# Patient Record
Sex: Male | Born: 1995 | Race: Black or African American | Hispanic: No | Marital: Single | Smoking: Current every day smoker
Health system: Southern US, Community
[De-identification: ages and names within clinical notes are randomized; demographics above are authoritative.]

---
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
## 2016-01-21 ENCOUNTER — Ambulatory Visit (INDEPENDENT_AMBULATORY_CARE_PROVIDER_SITE_OTHER): Payer: Medicaid Other | Admitting: Family Medicine

## 2016-01-21 ENCOUNTER — Encounter: Payer: Self-pay | Admitting: Family Medicine

## 2016-01-21 ENCOUNTER — Other Ambulatory Visit (HOSPITAL_COMMUNITY)
Admission: RE | Admit: 2016-01-21 | Discharge: 2016-01-21 | Disposition: A | Discharge status: Home / Self Care | Payer: Medicaid Other | Source: Ambulatory Visit | Attending: Family Medicine | Admitting: Family Medicine

## 2016-01-21 VITALS — BP 118/73 | HR 62 | Temp 97.7°F | Wt 187.0 lb

## 2016-01-21 DIAGNOSIS — Z202 Contact with and (suspected) exposure to infections with a predominantly sexual mode of transmission: Secondary | ICD-10-CM | POA: Diagnosis not present

## 2016-01-21 DIAGNOSIS — A630 Anogenital (venereal) warts: Secondary | ICD-10-CM

## 2016-01-21 DIAGNOSIS — M940 Chondrocostal junction syndrome [Tietze]: Secondary | ICD-10-CM | POA: Insufficient documentation

## 2016-01-21 DIAGNOSIS — Z113 Encounter for screening for infections with a predominantly sexual mode of transmission: Secondary | ICD-10-CM

## 2016-01-21 DIAGNOSIS — Z20828 Contact with and (suspected) exposure to other viral communicable diseases: Secondary | ICD-10-CM | POA: Diagnosis not present

## 2016-01-21 MED ORDER — IMIQUIMOD 5 % EX CREA: TOPICAL_CREAM | CUTANEOUS | Status: DC

## 2016-01-21 MED ORDER — NAPROXEN 500 MG PO TABS: 500.0000 mg | ORAL_TABLET | Freq: Two times a day (BID) | ORAL | Status: DC

## 2016-01-21 NOTE — Progress Notes (Signed)
   Subjective:   Zachary Mccarty is a 20 y.o. male with a history of THC use disorder here for same day visit for cough with chest pain and black dots on penis  Cough - present for 1 wk  - chest pain in L side when coughing or moving suddenly - wakes up with pain in chest in AM - L side pain when coughing as well - no h/o pulm/cardiac problems - non-productive - denies fevers, nasal congestion - no medications for this  Black dots on penis - present for ~ 5 months  - treated for chlamydia 11/06/15 - new sexual partner with 1 male - unprotected - painful with sexual intercourse - not changing at all - no penile discharge or urinary symptoms   Would like to come back for appt to discuss gynecomastia further  Review of Systems:  Per HPI.   Social History: current smoker - 1 cig q2 wks  Objective:  BP 118/73 mmHg  Pulse 62  Temp(Src) 97.7 F (36.5 C) (Oral)  Wt 187 lb (84.823 kg)  Gen:  20 y.o. male in NAD HEENT: NCAT, MMM, EOMI, PERRL, anicteric sclerae CV: RRR, no MRG Resp: Non-labored, CTAB, no wheezes noted Abd: Soft, NTND, BS present, no guarding or organomegaly Male genitalia: no testicular masses Penis: circumcised and warts Testicles: normal, no masses Scrotum: normal Ext: WWP, no edema MSK: TTP over anterior rib cartilage  Neuro: Alert and oriented, speech normal     Assessment & Plan:     Zachary Mccarty is a 20 y.o. 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) Exam consistent with genital warts Explained in detail what this is and about HPV which causes this Advised no unprotected sex Advised discussing with partner Also screen for RPR, HIV, GC chlamydia today Treat with Imiquimod cream 3 times weekly until resolution No more than 16 weeks of treatment Follow-up in 4 weeks  Costochondritis Exam consistent with costochondritis of left rib cage Lungs clear no evidence of pneumonia No evidence of cardiac etiology for  chest pain Start naproxen twice a day Follow-up as needed       Erasmo DownerAngela M Bacigalupo, MD MPH PGY-3,  [facility] 01/21/2016  12:26 PM

## 2016-01-21 NOTE — Assessment & Plan Note (Signed)
Exam consistent with genital warts Explained in detail what this is and about HPV which causes this Advised no unprotected sex Advised discussing with partner Also screen for RPR, HIV, GC chlamydia today Treat with Imiquimod cream 3 times weekly until resolution No more than 16 weeks of treatment Follow-up in 4 weeks

## 2016-01-21 NOTE — Patient Instructions (Signed)
Use Imiquimod cream three times per week (Monday, Wednesday, Friday) at bedtime. Apply to all of the warts. Wash this off in the morning with soap and water. Continue to use this 3 times weekly until the warts are completely gone. Will use it for a maximum of 16 weeks.  Your rib pain is call costochondritis. See more information below. Take naproxen twice daily to help with inflammation and pain.  Come back in about one month to follow-up on the warts and talk about the gynecomastia.  Genital Warts Genital warts are a common STD (sexually transmitted disease). They may appear as small bumps on the tissues of the genital area or anal area. Sometimes, they can become irritated and cause pain. Genital warts are easily passed to other people through sexual contact. Getting treatment is important because genital warts can lead to other problems. In females, the virus that causes genital warts may increase the risk of cervical cancer. CAUSES Genital warts are caused by a virus that is called human papillomavirus (HPV). HPV is spread by having unprotected sex with an infected person. It can be spread through vaginal, anal, and oral sex. Many people do not know that they are infected. They may be infected for years without problems. However, even if they do not have problems, they can pass the infection to their sexual partners. RISK FACTORS Genital warts are more likely to develop in:  People who have unprotected sex.  People who have multiple sexual partners.  People who become sexually active before they are 19 years of age.  Men who are not circumcised.  Women who have a male sexual partner who is not circumcised.  People who have a weakened body defense system (immune system) due to disease or medicine.  People who smoke. SYMPTOMS Symptoms of genital warts include:  Small growths in the genital area or anal area. These warts often grow in clusters.  Itching and irritation in the genital  area or anal area.  Bleeding from the warts.  Painful sexual intercourse. DIAGNOSIS Genital warts can usually be diagnosed from their appearance on the vagina, vulva, penis, perineum, anus, or rectum. Tests may also be done, such as:  Biopsy. A tissue sample is removed so it can be looked at under a microscope.  Colposcopy. In females, a magnifying tool is used to examine the vagina and cervix. Certain solutions may be used to make the HPV cells change color so they can be seen more easily.  A Pap test in females.  Tests for other STDs. TREATMENT Treatment for genital warts may include:  Applying prescription medicines to the warts. These may be solutions or creams.  Freezing the warts with liquid nitrogen (cryotherapy).  Burning the warts with:  Laser treatment.  An electrified probe (electrocautery).  Injecting a substance (Candida antigen or Trichophyton antigen) into the warts to help the body's immune system to fight off the warts.  Interferon injections.  Surgery to remove the warts. HOME CARE INSTRUCTIONS Medicines  Apply over-the-counter and prescription medicines only as told by your health care provider.  Do not treat genital warts with medicines that are used for treating hand warts.  Talk with your health care provider about using over-the-counter anti-itch creams. General Instructions  Do not touch or scratch the warts.  Do not have sex until your treatment has been completed.  Tell your current and past sexual partners about your condition because they may also need treatment.  Keep all follow-up visits as told by your  health care provider. This is important.  After treatment, use condoms during sex to prevent future infections. Other Instructions for Women  Women who have genital warts might need increased screening for cervical cancer. This type of cancer is slow growing and can be cured if it is found early. Chances of developing cervical cancer  are increased with HPV.  If you become pregnant, tell your health care provider that you have had HPV. Your health care provider will monitor you closely during pregnancy to be sure that your baby is safe. PREVENTION Talk with your health care provider about getting the HPV vaccines. These vaccines prevent some HPV infections and cancers. It is recommended that the vaccine be given to males and females who are 57-54 years of age. It will not work if you already have HPV, and it is not recommended for pregnant women. SEEK MEDICAL CARE IF:  You have redness, swelling, or pain in the area of the treated skin.  You have a fever.  You feel generally ill.  You feel lumps in and around your genital area or anal area.  You have bleeding in your genital area or anal area.  You have pain during sexual intercourse.   This information is not intended to replace advice given to you by your health care provider. Make sure you discuss any questions you have with your health care provider.   Document Released: 07/04/2000 Document Revised: 03/28/2015 Document Reviewed: 10/02/2014 Elsevier Interactive Patient Education 2016 Elsevier Inc. Costochondritis Costochondritis, sometimes called Tietze syndrome, is a swelling and irritation (inflammation) of the tissue (cartilage) that connects your ribs with your breastbone (sternum). It causes pain in the chest and rib area. Costochondritis usually goes away on its own over time. It can take up to 6 weeks or longer to get better, especially if you are unable to limit your activities. CAUSES  Some cases of costochondritis have no known cause. Possible causes include:  Injury (trauma).  Exercise or activity such as lifting.  Severe coughing. SIGNS AND SYMPTOMS  Pain and tenderness in the chest and rib area.  Pain that gets worse when coughing or taking deep breaths.  Pain that gets worse with specific movements. DIAGNOSIS  Your health care provider  will do a physical exam and ask about your symptoms. Chest X-rays or other tests may be done to rule out other problems. TREATMENT  Costochondritis usually goes away on its own over time. Your health care provider may prescribe medicine to help relieve pain. HOME CARE INSTRUCTIONS   Avoid exhausting physical activity. Try not to strain your ribs during normal activity. This would include any activities using chest, abdominal, and side muscles, especially if heavy weights are used.  Apply ice to the affected area for the first 2 days after the pain begins.  Put ice in a plastic bag.  Place a towel between your skin and the bag.  Leave the ice on for 20 minutes, 2-3 times a day.  Only take over-the-counter or prescription medicines as directed by your health care provider. SEEK MEDICAL CARE IF:  You have redness or swelling at the rib joints. These are signs of infection.  Your pain does not go away despite rest or medicine. SEEK IMMEDIATE MEDICAL CARE IF:   Your pain increases or you are very uncomfortable.  You have shortness of breath or difficulty breathing.  You cough up blood.  You have worse chest pains, sweating, or vomiting.  You have a fever or persistent symptoms for  more than 2-3 days.  You have a fever and your symptoms suddenly get worse. MAKE SURE YOU:   Understand these instructions.  Will watch your condition.  Will get help right away if you are not doing well or get worse.   This information is not intended to replace advice given to you by your health care provider. Make sure you discuss any questions you have with your health care provider.   Document Released: 04/16/2005 Document Revised: 04/27/2013 Document Reviewed: 02/08/2013 Elsevier Interactive Patient Education [employer] Elsevier Inc.

## 2016-01-21 NOTE — Assessment & Plan Note (Signed)
Exam consistent with costochondritis of left rib cage Lungs clear no evidence of pneumonia No evidence of cardiac etiology for chest pain Start naproxen twice a day Follow-up as needed

## 2016-01-22 LAB — RPR

## 2016-01-22 LAB — HIV ANTIBODY (ROUTINE TESTING W REFLEX): HIV 1&2 Ab, 4th Generation: NONREACTIVE

## 2016-01-23 LAB — URINE CYTOLOGY ANCILLARY ONLY
Chlamydia: NEGATIVE
NEISSERIA GONORRHEA: NEGATIVE

## 2016-01-25 ENCOUNTER — Telehealth: Payer: Self-pay | Admitting: *Deleted

## 2016-01-25 NOTE — Telephone Encounter (Signed)
Pt informed. Cleotha Tsang, CMA  

## 2016-01-25 NOTE — Telephone Encounter (Signed)
-----   Message from Erasmo DownerAngela M Bacigalupo, MD sent at 01/24/2016 11:35 AM EDT ----- Please let patient know that STD testing, including GC/CT, HIV, RPR neg.  This does not mean that genital warts are not present, this is not tested for.  Erasmo DownerAngela M Bacigalupo, MD, MPH PGY-3,  [facility] 01/24/2016 11:35 AM

## 2016-04-30 ENCOUNTER — Ambulatory Visit: Payer: Medicaid Other | Admitting: Family Medicine

## 2016-04-30 NOTE — Progress Notes (Deleted)
   Subjective:   Zachary AlkenDustin J Mccarty is a 20 y.o. male with a history of genital warts, gynecomastia, THC use here for same day appt for ***  ***  Review of Systems:  Per HPI.   Social History: *** smoker  Objective:  There were no vitals taken for this visit.  Gen:  20 y.o. male in NAD *** HEENT: NCAT, MMM, EOMI, PERRL, anicteric sclerae CV: RRR, no MRG, no JVD Resp: Non-labored, CTAB, no wheezes noted Abd: Soft, NTND, BS present, no guarding or organomegaly Ext: WWP, no edema MSK: Full ROM, strength intact Neuro: Alert and oriented, speech normal     Assessment & Plan:     Zachary Mccarty is a 20 y.o. male here for ***  No problem-specific Assessment & Plan notes found for this encounter.      Erasmo DownerAngela M Yasamin Karel, MD MPH PGY-3,  [facility] 04/30/2016  8:44 AM

## 2016-05-21 ENCOUNTER — Ambulatory Visit (INDEPENDENT_AMBULATORY_CARE_PROVIDER_SITE_OTHER): Payer: Medicaid Other | Admitting: Family Medicine

## 2016-05-21 VITALS — BP 121/66 | HR 66 | Temp 98.3°F | Ht 72.0 in | Wt 186.0 lb

## 2016-05-21 DIAGNOSIS — N62 Hypertrophy of breast: Secondary | ICD-10-CM | POA: Diagnosis not present

## 2016-05-21 DIAGNOSIS — A630 Anogenital (venereal) warts: Secondary | ICD-10-CM

## 2016-05-21 MED ORDER — IMIQUIMOD 5 % EX CREA: TOPICAL_CREAM | CUTANEOUS | 1 refills | Status: DC

## 2016-05-21 NOTE — Progress Notes (Signed)
   Subjective:   Zachary Mccarty is a 20 y.o. male with a history of Genital warts here for follow-up of warts  Patient was seen 01/21/16 for genital warts. He was prescribed imiquimod, but was unable to pick this up from the pharmacy. He reports that the warts have not changed at all as he has not treated them. He is using condoms for sexual intercourse.  He reports that he has had trouble with bilateral gynecomastia since age 20. He reports she's never had a workup for this. He reports his 19 year old brother is now starting to get gynecomastia as well. He denies any family history of breast cancer. He denies any fevers, testicular lumps.  Review of Systems:  Per HPI.   Social History: Smoking history noted  Objective:  BP 121/66   Pulse 66   Temp 98.3 F (36.8 C) (Oral)   Ht 6' (1.829 m)   Wt 186 lb (84.4 kg)   BMI 25.23 kg/m   Gen:  20 y.o. male in NAD HEENT: NCAT, MMM, EOMI, PERRL, anicteric sclerae CV: RRR, no MRG Resp: Non-labored, CTAB, no wheezes noted Abd: Soft, NTND, BS present, no guarding or organomegaly Male genitalia: not done Penis: circumcised and warts Testicles: normal, no masses Scrotum: normal  Tanner stage IV Ext: WWP, no edema Neuro: Alert and oriented, speech normal     Assessment & Plan:     Zachary Mccarty is a 20 y.o. 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

## 2016-05-21 NOTE — Assessment & Plan Note (Signed)
Patient unable to stay for examination or further discussion at this time Consider THC usage as possible etiology Likely persistent pubertal gynecomastia however At next visit check CMP to rule out cirrhosis, CK D, also check TSH to rule out hyperthyroidism GU exam today with normal secondary sex characteristics, so hypogonadism unlikely and no testicular tumors Can also check testosterone level and prolactin Ultimately can consider surgical correction There is also some evidence on tamoxifen use in males without hypogonadism - would need referral to endocrinology to consider this F/u when schedule permits

## 2016-05-21 NOTE — Patient Instructions (Signed)
Genital Warts Genital warts are a common STD (sexually transmitted disease). They may appear as small bumps on the tissues of the genital area or anal area. Sometimes, they can become irritated and cause pain. Genital warts are easily passed to other people through sexual contact. Getting treatment is important because genital warts can lead to other problems. In females, the virus that causes genital warts may increase the risk of cervical cancer. CAUSES Genital warts are caused by a virus that is called human papillomavirus (HPV). HPV is spread by having unprotected sex with an infected person. It can be spread through vaginal, anal, and oral sex. Many people do not know that they are infected. They may be infected for years without problems. However, even if they do not have problems, they can pass the infection to their sexual partners. RISK FACTORS Genital warts are more likely to develop in:  People who have unprotected sex.  People who have multiple sexual partners.  People who become sexually active before they are 19 years of age.  Men who are not circumcised.  Women who have a male sexual partner who is not circumcised.  People who have a weakened body defense system (immune system) due to disease or medicine.  People who smoke. SYMPTOMS Symptoms of genital warts include:  Small growths in the genital area or anal area. These warts often grow in clusters.  Itching and irritation in the genital area or anal area.  Bleeding from the warts.  Painful sexual intercourse. DIAGNOSIS Genital warts can usually be diagnosed from their appearance on the vagina, vulva, penis, perineum, anus, or rectum. Tests may also be done, such as:  Biopsy. A tissue sample is removed so it can be looked at under a microscope.  Colposcopy. In females, a magnifying tool is used to examine the vagina and cervix. Certain solutions may be used to make the HPV cells change color so they can be seen more  easily.  A Pap test in females.  Tests for other STDs. TREATMENT Treatment for genital warts may include:  Applying prescription medicines to the warts. These may be solutions or creams.  Freezing the warts with liquid nitrogen (cryotherapy).  Burning the warts with:  Laser treatment.  An electrified probe (electrocautery).  Injecting a substance (Candida antigen or Trichophyton antigen) into the warts to help the body's immune system to fight off the warts.  Interferon injections.  Surgery to remove the warts. HOME CARE INSTRUCTIONS Medicines  Apply over-the-counter and prescription medicines only as told by your health care provider.  Do not treat genital warts with medicines that are used for treating hand warts.  Talk with your health care provider about using over-the-counter anti-itch creams. General Instructions  Do not touch or scratch the warts.  Do not have sex until your treatment has been completed.  Tell your current and past sexual partners about your condition because they may also need treatment.  Keep all follow-up visits as told by your health care provider. This is important.  After treatment, use condoms during sex to prevent future infections. Other Instructions for Women  Women who have genital warts might need increased screening for cervical cancer. This type of cancer is slow growing and can be cured if it is found early. Chances of developing cervical cancer are increased with HPV.  If you become pregnant, tell your health care provider that you have had HPV. Your health care provider will monitor you closely during pregnancy to be sure that your   baby is safe. PREVENTION Talk with your health care provider about getting the HPV vaccines. These vaccines prevent some HPV infections and cancers. It is recommended that the vaccine be given to males and females who are 9-26 years of age. It will not work if you already have HPV, and it is not  recommended for pregnant women. SEEK MEDICAL CARE IF:  You have redness, swelling, or pain in the area of the treated skin.  You have a fever.  You feel generally ill.  You feel lumps in and around your genital area or anal area.  You have bleeding in your genital area or anal area.  You have pain during sexual intercourse.   This information is not intended to replace advice given to you by your health care provider. Make sure you discuss any questions you have with your health care provider.   Document Released: 07/04/2000 Document Revised: 03/28/2015 Document Reviewed: 10/02/2014 Elsevier Interactive Patient Education 2016 Elsevier Inc.  

## 2016-05-21 NOTE — Assessment & Plan Note (Signed)
Exam consistent with genital warts Discussed HPV and genital warts in detail with patient Advised no unprotected sex while warts are active Recent STD testing negative Treat with imiquimod cream 3 times weekly until resolution No more than 16 weeks of treatment Follow-up as needed

## 2016-06-17 ENCOUNTER — Telehealth: Payer: Self-pay | Admitting: Family Medicine

## 2016-06-17 NOTE — Telephone Encounter (Signed)
Would like to talk to dr B about something-no details were given

## 2016-06-18 NOTE — Telephone Encounter (Signed)
Return patient's call. He is concerned because his genital warts have not improved and may have spread a little bit despite using the imiquimod cream 3 times weekly as prescribed. He denies any fevers, penile discharge, dysuria.  Advised patient to continue use the cream for 1-2 more weeks. If warts seem to still not be improving or be spreading, he will call and make a follow-up appointment at that time. Advised that the other option would be cryotherapy which she may need to see a urologist for.  Angela M Bacigalupo, MD, MPH PGY-3,  [facility] 06/18/2016 9:50 AM

## 2016-06-30 ENCOUNTER — Encounter: Payer: Self-pay | Admitting: Family Medicine

## 2016-06-30 ENCOUNTER — Ambulatory Visit (INDEPENDENT_AMBULATORY_CARE_PROVIDER_SITE_OTHER): Payer: Medicaid Other | Admitting: Family Medicine

## 2016-06-30 VITALS — BP 120/68 | HR 84 | Temp 98.9°F | Ht 72.0 in | Wt 192.8 lb

## 2016-06-30 DIAGNOSIS — R21 Rash and other nonspecific skin eruption: Secondary | ICD-10-CM

## 2016-06-30 MED ORDER — LIDOCAINE 4 % EX GEL: CUTANEOUS | 0 refills | Status: DC

## 2016-06-30 MED ORDER — VALACYCLOVIR HCL 1 G PO TABS: 1000.0000 mg | ORAL_TABLET | Freq: Two times a day (BID) | ORAL | 0 refills | Status: DC

## 2016-06-30 NOTE — Patient Instructions (Signed)
STOP using the Aldara cream.  This may be a medication reaction.  I am concerned that this may be a herpes virus.  I am starting you on valtrex to take twice daily for the next 10 days.  I will call you if your culture is negative.  I have also sent in a pain cream for you.  Follow up with Dr Beryle FlockBacigalupo in 1 week.

## 2016-06-30 NOTE — Progress Notes (Signed)
   Subjective: CC: genital rash WUJ:WJXBJYHPI:Yesenia Barton DuboisJ Steppe is a 20 y.o. male presenting to clinic today for same day appointment. PCP: Shirlee LatchAngela Bacigalupo, MD Concerns today include:  1. Genital rash Patient seen about 6 weeks ago for genital warts.  He was started on Aldara.  He reports that up until about 2 weeks ago, he was tolerating medication well.  He reports that about 2 weeks ago, he started getting vesicular lesions with pain and irritation along his genitals.  He reports he continued to apply the Aldara until about 4 days ago because it seemed to be working.  He denies penile discharge, dysuria, hematuria, fevers, chills, nausea, vomiting.  Social History Reviewed. FamHx and MedHx reviewed.  Please see EMR.  ROS: Per HPI  Objective: Office vital signs reviewed. BP 120/68   Pulse 84   Temp 98.9 F (37.2 C) (Oral)   Ht 6' (1.829 m)   Wt 192 lb 12.8 oz (87.5 kg)   BMI 26.15 kg/m   Physical Examination:  General: Awake, alert, well nourished, No acute distress GU: no penile discharge appreciated.  Several ulcerative lesions scattered along the base on the dorsal side of the penis.  These are TTP.  No overt vesicles appreciated.  No bleeding from lesions.    Assessment/ Plan: 20 y.o. male   1. Rash of genital area.  This may be a drug reaction.  Per UTD, redness, irritation,ulceration/ erosions and vessicles are common with Aldara use.  However, cannot r/o HSV2 infection at this time.  While no discrete vesicles were appreciated, sample of lesion fluid was obtained and sent for culture.  Will also empirically treat for HSV. - Concerns discussed with patient - STOP Aldara cream for now.  Will allow drug holiday to see if lesions resolve/ improve - Could consider restarting vs switching medication - valACYclovir (VALTREX) 1000 MG tablet; Take 1 tablet (1,000 mg total) by mouth 2 (two) times daily.  Dispense: 20 tablet; Refill: 0 - Lidocaine 4 % GEL; Apply a small amount to affected  areas up to 4 times daily as needed for pain.  Dispense: 30 g; Refill: 0 - Herpes simplex virus culture - Will call with results  Follow up with PCP in 1 week  Raliegh IpAshly M Gottschalk, DO PGY-3, [facility]

## 2016-07-01 ENCOUNTER — Telehealth: Payer: Self-pay | Admitting: *Deleted

## 2016-07-02 LAB — HERPES SIMPLEX VIRUS CULTURE: Organism ID, Bacteria: NOT DETECTED

## 2016-07-07 ENCOUNTER — Ambulatory Visit (INDEPENDENT_AMBULATORY_CARE_PROVIDER_SITE_OTHER): Payer: Medicaid Other | Admitting: Family Medicine

## 2016-07-07 VITALS — BP 125/80 | HR 81 | Temp 98.7°F | Ht 72.0 in | Wt 179.0 lb

## 2016-07-07 DIAGNOSIS — N62 Hypertrophy of breast: Secondary | ICD-10-CM | POA: Diagnosis present

## 2016-07-07 DIAGNOSIS — R21 Rash and other nonspecific skin eruption: Secondary | ICD-10-CM

## 2016-07-07 NOTE — Patient Instructions (Signed)
It was a pleasure seeing you today in our clinic. Today we discussed your rash and gynecomastia. Here is the treatment plan we have discussed and agreed upon together:   - At this time I do not believe that you have herpes. The reaction you had was most likely a result of the cream that you had been applying. - Today and ordering some labs to check into your persistent gynecomastia. I will mail you the results of these labs. Please make a follow-up appointment with your PCP.

## 2016-07-07 NOTE — Assessment & Plan Note (Signed)
Patient wanted to discuss his gynecomastia. Symptoms have persisted for multiple years. Most individuals have improvement in symptoms after approximately one year. Patient is well out of this timeframe. I had initially placed orders to check patient's labs to assess a possible cause. Unfortunately, upon telling patient that I would like to take blood tests patient's mood/attitude changed abruptly and he insisted that he did not have time to get this done (even with our lab being available at this time). Labs were canceled and patient was informed that he would have to follow-up with his PCP. 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 Due to this presentation it is unlikely that this is any breast carcinoma. - Consider general surgery/plastic surgery consultation if lab results are unremarkable.

## 2016-07-07 NOTE — Assessment & Plan Note (Signed)
Patient is here for follow-up on his genital rash. He was worried that this could be his initial presentation of HSV-2. Culture that was performed last week is negative for herpes virus. Rash is significantly improved according to the patient since last week. Although it is difficult to assess as I was not present for patient's exam last week I feels that is most likely that patient does NOT have a diagnosis of HSV. It is much more likely that patient had a response to the Aldara cream that he was prescribed.  - Avoidance of Aldara cream in the future - Discussed safe sex practices.

## 2016-07-07 NOTE — Progress Notes (Signed)
   HPI  CC: Follow-up skin rash Patient is here for follow-up on his skin rash. He states that he was here last week for evaluation on a rash on his genitals. 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 He states that he has had significant improvement in his symptoms since that visit. All lesions are either fully healed or in the healing process. He is very concerned that this could be herpes and he wants to make sure that he knows what the diagnosis was.  He denies any worsening pain or new skin ulceration. No fevers, chills, headache, diaphoresis, malaise, fatigue. No dysuria, hematuria, or pyuria.  Gynecomastia: Patient also asking about his gynecomastia. He states that he has been dealing with this since the seventh grade. He was told that his symptoms would resolve with time but they have persisted. He denies any pain. No asymmetry.  Review of Systems    See HPI for ROS. All other systems reviewed and are negative.  CC, SH/smoking status, and VS noted  Objective: BP 125/80 (BP Location: Left Arm, Patient Position: Sitting, Cuff Size: Normal)   Pulse 81   Temp 98.7 F (37.1 C) (Oral)   Ht 6' (1.829 m)   Wt 179 lb (81.2 kg)   SpO2 99%   BMI 24.28 kg/m  Gen: NAD, alert, cooperative CV: RRR, no murmur Resp: CTAB, no wheezes, non-labored Chest: Bilateral and symmetric fatty tissue hypertrophy at the areolas. No extension beyond the areolar borders. No pain or tenderness. No discharge. Male genitalia: Penis: circumcised and lesions: Healing sclerotic lesions at the base of the glans and posterior aspect of the shaft of the penis Testicles: normal, no masses Scrotum: Areas of healing lesions noted without evidence of vesicles or persistent ulceration   Assessment and plan:  Rash and nonspecific skin eruption Patient is here for follow-up on his genital rash. He was worried that this could be his initial presentation of HSV-2.  Culture that was performed last week is negative for herpes virus. Rash is significantly improved according to the patient since last week. Although it is difficult to assess as I was not present for patient's exam last week I feels that is most likely that patient does NOT have a diagnosis of HSV. It is much more likely that patient had a response to the Aldara cream that he was prescribed.  - Avoidance of Aldara cream in the future - Discussed safe sex practices.  Gynecomastia Patient wanted to discuss his gynecomastia. Symptoms have persisted for multiple years. Most individuals have improvement in symptoms after approximately one year. Patient is well out of this timeframe. I had initially placed orders to check patient's labs to assess a possible cause. Unfortunately, upon telling patient that I would like to take blood tests patient's mood/attitude changed abruptly and he insisted that he did not have time to get this done (even with our lab being available at this time). Labs were canceled and patient was informed that he would have to follow-up with his PCP. 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 Due to this presentation it is unlikely that this is any breast carcinoma. - Consider general surgery/plastic surgery consultation if lab results are unremarkable.    Kathee DeltonIan D Jeannie Mallinger, MD,MS,  PGY3 07/07/2016 5:38 PM

## 2017-02-15 IMAGING — DX DG ANKLE COMPLETE 3+V*L*
3 series · 3 of 3 positions shown · non-contrast
Comparison: None.

CLINICAL DATA: Twisting injury to the left ankle while playing
basketball, with left ankle swelling and pain. Initial encounter.

EXAM:
LEFT ANKLE COMPLETE - 3+ VIEW

[ankle ap]
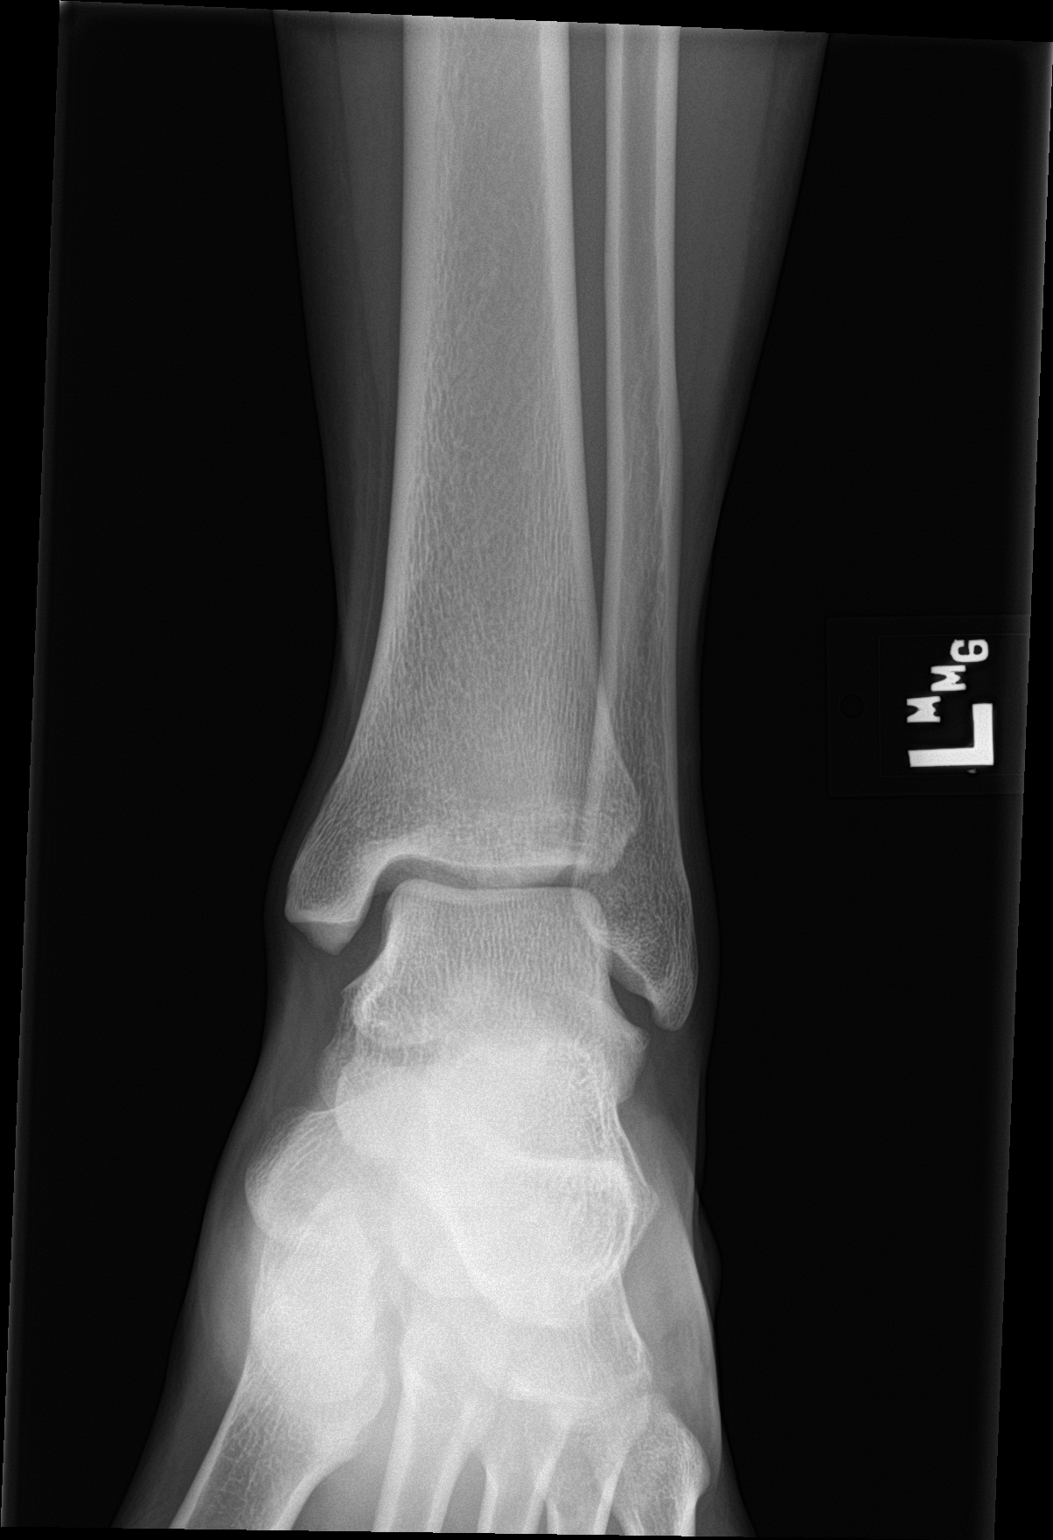

[ankle obl]
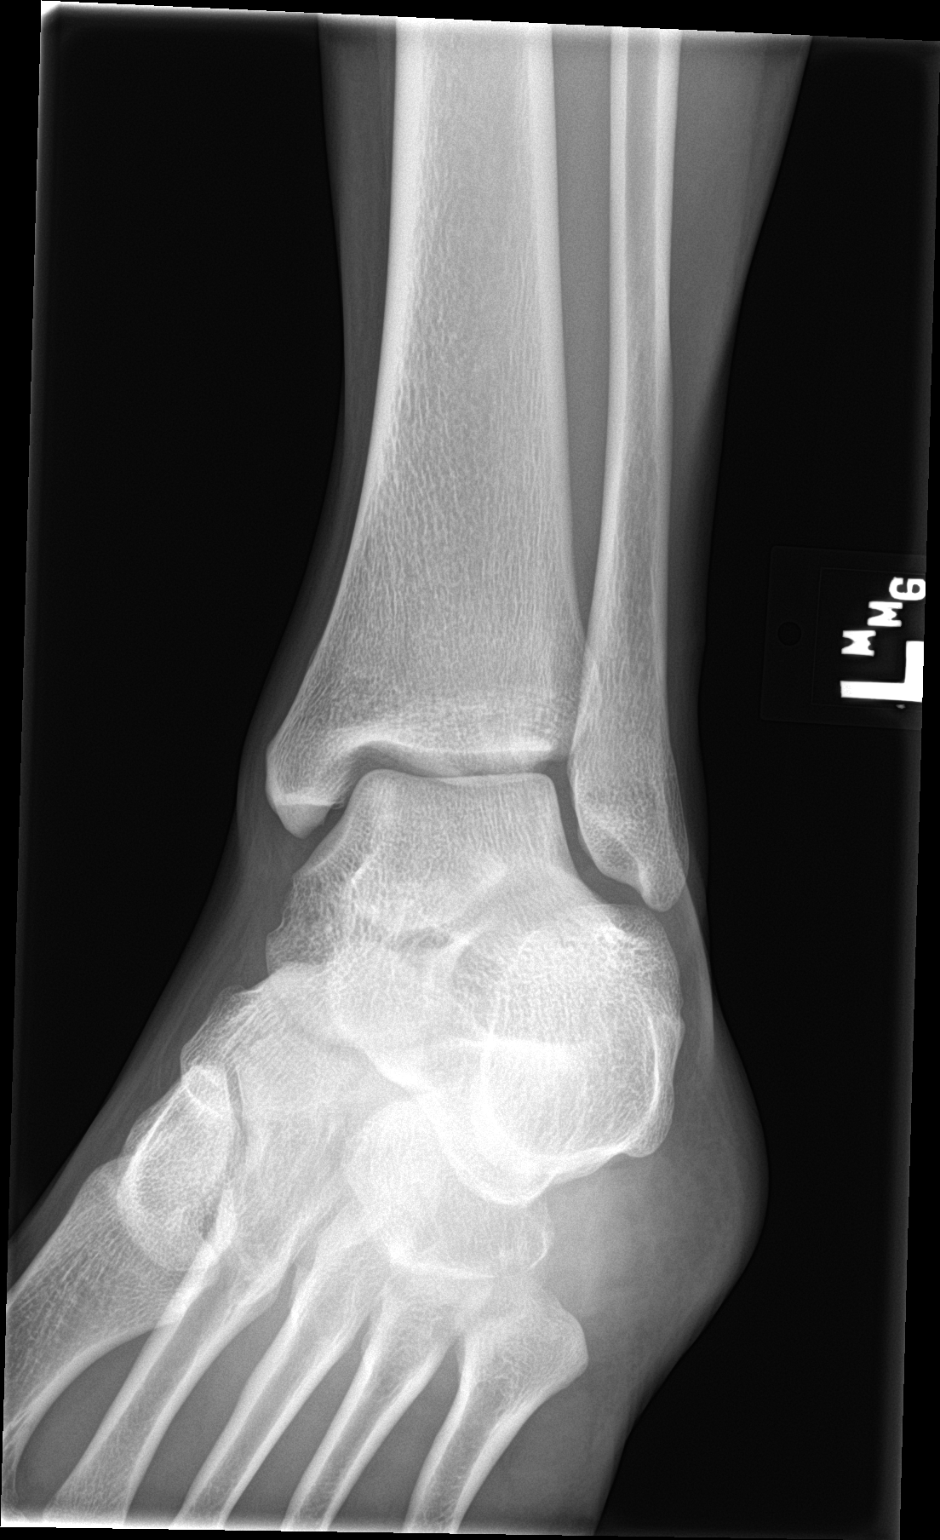

[ankle lat]
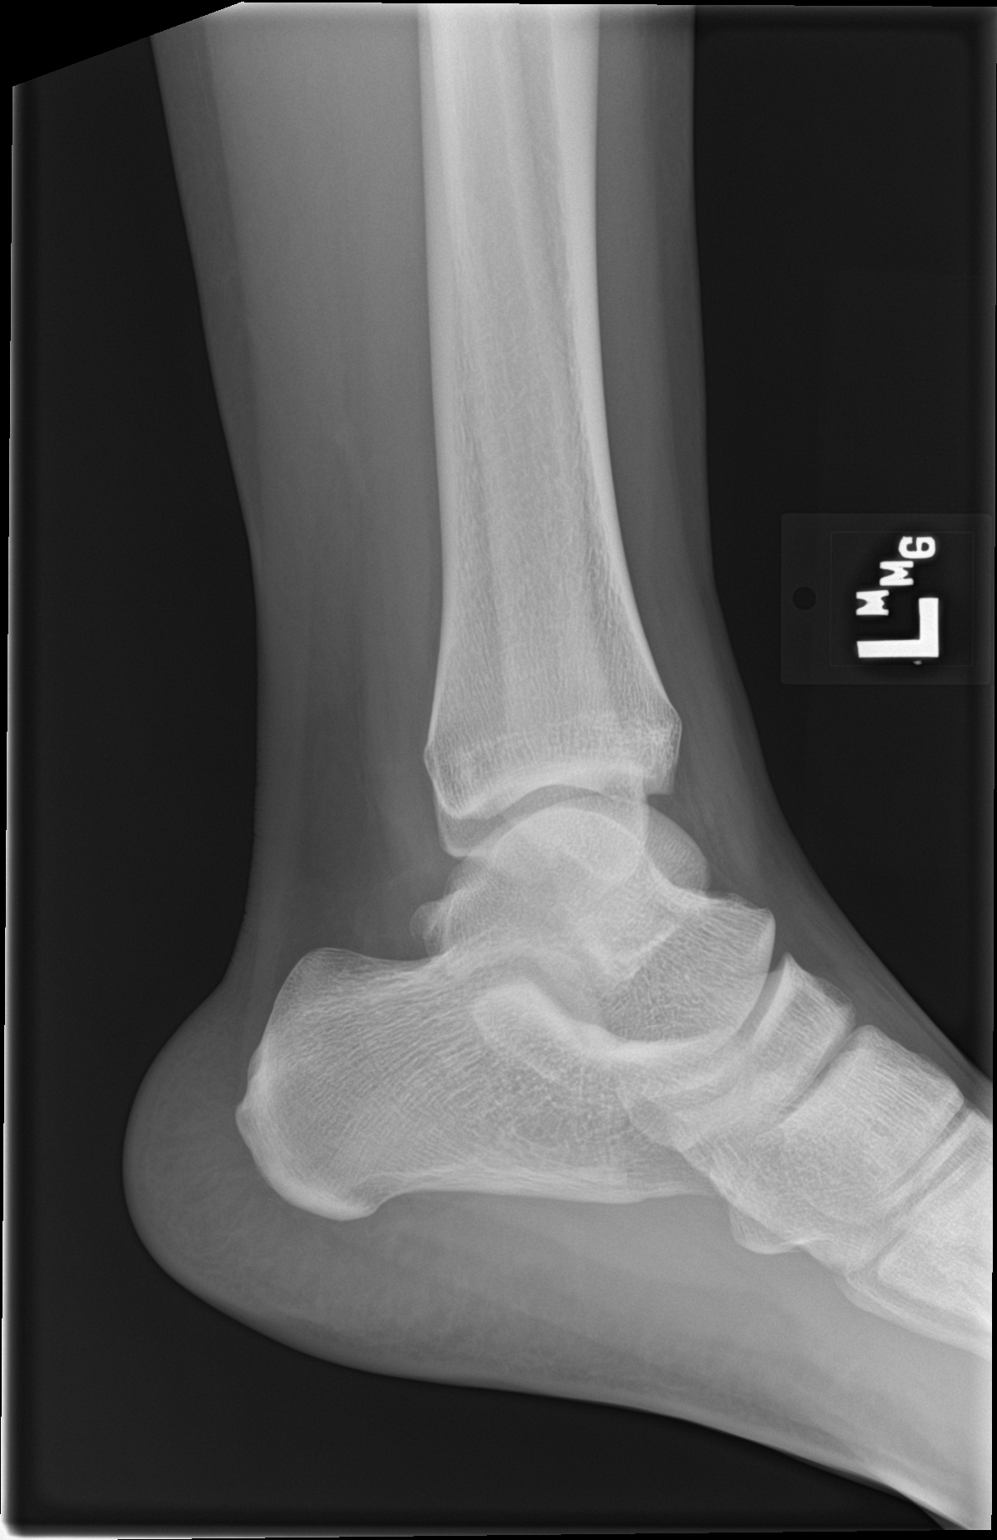

[3 of 3 positions shown; findings below may reference images not displayed]

FINDINGS: There is no evidence of fracture or dislocation. The ankle mortise
is intact; the interosseous space is within normal limits. No talar
tilt or subluxation is seen.

The joint spaces are preserved. No significant soft tissue
abnormalities are seen.
IMPRESSION: No evidence of fracture or dislocation.

## 2017-06-16 ENCOUNTER — Encounter (HOSPITAL_COMMUNITY): Payer: Self-pay | Admitting: Emergency Medicine

## 2017-06-16 ENCOUNTER — Ambulatory Visit (HOSPITAL_COMMUNITY)
Admission: EM | Admit: 2017-06-16 | Discharge: 2017-06-16 | Disposition: A | Discharge status: Home / Self Care | Payer: Self-pay | Attending: Family Medicine | Admitting: Family Medicine

## 2017-06-16 DIAGNOSIS — Z202 Contact with and (suspected) exposure to infections with a predominantly sexual mode of transmission: Secondary | ICD-10-CM | POA: Insufficient documentation

## 2017-06-16 DIAGNOSIS — Z113 Encounter for screening for infections with a predominantly sexual mode of transmission: Secondary | ICD-10-CM | POA: Insufficient documentation

## 2017-06-16 MED ORDER — AZITHROMYCIN 250 MG PO TABS
ORAL_TABLET | ORAL | Status: AC
  Filled 2017-06-16: qty 4

## 2017-06-16 MED ORDER — CEFTRIAXONE SODIUM 250 MG IJ SOLR
250.0000 mg | Freq: Once | INTRAMUSCULAR | Status: AC
  Administered 2017-06-16: 250 mg via INTRAMUSCULAR

## 2017-06-16 MED ORDER — AZITHROMYCIN 250 MG PO TABS
1000.0000 mg | ORAL_TABLET | Freq: Once | ORAL | Status: AC
  Administered 2017-06-16: 1000 mg via ORAL

## 2017-06-16 MED ORDER — CEFTRIAXONE SODIUM 250 MG IJ SOLR
INTRAMUSCULAR | Status: AC
  Filled 2017-06-16: qty 250

## 2017-06-16 NOTE — ED Triage Notes (Signed)
PT requests STD testing. No symptoms

## 2017-06-16 NOTE — Discharge Instructions (Signed)
You have been given the following medications today for treatment of suspected gonorrhea and/or chlamydia: ° °cefTRIAXone (ROCEPHIN) injection 250 mg °azithromycin (ZITHROMAX) tablet 1,000 mg ° °Even though we have treated you today, we have sent testing for sexually transmitted infections. We will notify you of any positive results once they are received. If required, we will prescribe any medications you might need. ° °

## 2017-06-17 ENCOUNTER — Telehealth (HOSPITAL_COMMUNITY): Payer: Self-pay | Admitting: Emergency Medicine

## 2017-06-17 LAB — URINE CYTOLOGY ANCILLARY ONLY
Chlamydia: NEGATIVE
Neisseria Gonorrhea: NEGATIVE
Trichomonas: NEGATIVE

## 2017-06-17 MED ORDER — METRONIDAZOLE 500 MG PO TABS: 500.0000 mg | ORAL_TABLET | Freq: Two times a day (BID) | ORAL | 0 refills | Status: AC

## 2017-06-17 NOTE — ED Provider Notes (Signed)
  [facility]   [id number] 06/16/17 Arrival Time: 1500  ASSESSMENT & PLAN:  1. Possible exposure to STD     Meds ordered this encounter  Medications  . cefTRIAXone (ROCEPHIN) injection 250 mg  . azithromycin (ZITHROMAX) tablet 1,000 mg    Urine cytology sent. Will notify of any positive results. Instructed to refrain from sexual activity for at least seven days.  Reviewed expectations re: course of current medical issues. Questions answered. Outlined signs and symptoms indicating need for more acute intervention. Patient verbalized understanding. After Visit Summary given.   SUBJECTIVE:  Zachary Mccarty is a 21 y.o. male who presents requesting STD testing. No symptoms. No h/o STD reported. Sexually active with male partner. Questions if exposed to STD because male partner experiencing vaginal discharge. Desires empiric treatment. ROS: As per HPI.  OBJECTIVE:  Vitals:   06/16/17 1529 06/16/17 1530  BP: 131/70   Pulse: 77   Resp: 16   Temp: 98.8 F (37.1 C)   TempSrc: Oral   SpO2: 100%   Weight:  180 lb (81.6 kg)  Height:  6' (1.829 m)     General appearance: alert, cooperative, appears stated age and no distress Throat: lips, mucosa, and tongue normal; teeth and gums normal Back: no CVA tenderness Abdomen: soft, non-tender; bowel sounds normal; no masses or organomegaly; no guarding or rebound tenderness GU: declines Skin: warm and dry Psychological:  Alert and cooperative. Normal mood and affect.  Results for orders placed or performed in visit on 06/30/16  Herpes simplex virus culture  Result Value Ref Range   Organism ID, Bacteria No Herpes Simplex Virus detected.     Labs Reviewed  URINE CYTOLOGY ANCILLARY ONLY    No Known Allergies   Social History   Socioeconomic History  . Marital status: Single    Spouse name: Not on file  . Number of children: Not on file  . Years of education: Not on file  . Highest education level:  Not on file  Social Needs  . Financial resource strain: Not on file  . Food insecurity - worry: Not on file  . Food insecurity - inability: Not on file  . Transportation needs - medical: Not on file  . Transportation needs - non-medical: Not on file  Occupational History  . Not on file  Tobacco Use  . Smoking status: Never Smoker  . Smokeless tobacco: Never Used  . Tobacco comment: 2 cigs a day  Substance and Sexual Activity  . Alcohol use: Yes    Alcohol/week: 0.0 oz    Comment: occ.  . Drug use: Not on file  . Sexual activity: Not on file  Other Topics Concern  . Not on file  Social History Narrative  . Not on file          Mardella LaymanHagler, Hedaya Latendresse, MD 06/17/17 1415

## 2017-06-17 NOTE — Telephone Encounter (Signed)
Pt called to inform Dr. Tracie HarrierHagler that his partner tested positive for Trichomonas.  He was told to call for further treatement.  Dr. Tracie HarrierHagler aware and asked to have Flagyl prescribed 500 mg, BID for 7 days.  Pt is aware that the medication will be sent to his pharmacy on file.

## 2017-10-08 NOTE — Telephone Encounter (Signed)
Opened in error. Deseree Blount, CMA 

## 2018-01-26 ENCOUNTER — Encounter (HOSPITAL_COMMUNITY): Payer: Self-pay | Admitting: Emergency Medicine

## 2018-01-26 ENCOUNTER — Ambulatory Visit (HOSPITAL_COMMUNITY)
Admission: EM | Admit: 2018-01-26 | Discharge: 2018-01-26 | Disposition: A | Discharge status: Home / Self Care | Payer: Self-pay | Attending: Family Medicine | Admitting: Family Medicine

## 2018-01-26 DIAGNOSIS — Z113 Encounter for screening for infections with a predominantly sexual mode of transmission: Secondary | ICD-10-CM | POA: Insufficient documentation

## 2018-01-26 DIAGNOSIS — R369 Urethral discharge, unspecified: Secondary | ICD-10-CM | POA: Insufficient documentation

## 2018-01-26 DIAGNOSIS — R300 Dysuria: Secondary | ICD-10-CM | POA: Insufficient documentation

## 2018-01-26 MED ORDER — CEFTRIAXONE SODIUM 250 MG IJ SOLR
250.0000 mg | Freq: Once | INTRAMUSCULAR | Status: AC
  Administered 2018-01-26: 250 mg via INTRAMUSCULAR

## 2018-01-26 MED ORDER — AZITHROMYCIN 250 MG PO TABS
1000.0000 mg | ORAL_TABLET | Freq: Once | ORAL | Status: AC
  Administered 2018-01-26: 1000 mg via ORAL

## 2018-01-26 MED ORDER — STERILE WATER FOR INJECTION IJ SOLN
INTRAMUSCULAR | Status: AC
  Filled 2018-01-26: qty 10

## 2018-01-26 MED ORDER — CEFTRIAXONE SODIUM 250 MG IJ SOLR
INTRAMUSCULAR | Status: AC
  Filled 2018-01-26: qty 250

## 2018-01-26 MED ORDER — AZITHROMYCIN 250 MG PO TABS
ORAL_TABLET | ORAL | Status: AC
  Filled 2018-01-26: qty 4

## 2018-01-26 NOTE — ED Provider Notes (Signed)
[facility]    CSN: [id number] Arrival date & time: 01/26/18  0865     History   Chief Complaint Chief Complaint  Patient presents with  . SEXUALLY TRANSMITTED DISEASE    HPI Zachary Mccarty is a 22 y.o. male.   HPI  Patient states penile discharge and dysuria for 2 to 3 days Here to be tested and treated for STDs No known exposure to STD He states his girlfriend thinks he has HPV and is making him use a cream that makes his penis peel.  He would like this looked at.  He did have HPV in the past. I reviewed his chart.  He has had HPV immunization series.  No fever or chills.  No abdominal pain.  No body rash.  No other illness   History reviewed. No pertinent past medical history.  Patient Active Problem List   Diagnosis Date Noted  . Rash and nonspecific skin eruption 07/07/2016  . 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 Tetrahydrocannabinol (THC) use disorder, mild, abuse 09/29/2014  . Gynecomastia 08/10/2012  . Allergic conjunctivitis of both eyes 07/30/2011    History reviewed. No pertinent surgical history.     Home Medications    Prior to Admission medications   Not on File    Family History No family history on file. Denies any significant family history of heart or hypertensive disease.  No cancer Social History Social History   Tobacco Use  . Smoking status: Never Smoker  . Smokeless tobacco: Never Used  . Tobacco comment: 2 cigs a day  Substance Use Topics  . Alcohol use: Yes    Alcohol/week: 0.0 oz    Comment: occ.  . Drug use: Not on file     Allergies   Patient has no known allergies.   Review of Systems Review of Systems   Physical Exam Triage Vital Signs ED Triage Vitals  Enc Vitals Group     BP 01/26/18 0847 124/87     Pulse Rate 01/26/18 0847 (!) 59     Resp 01/26/18 0847 16     Temp 01/26/18 0847 98.1 F (36.7 C)     Temp Source 01/26/18 0847 Oral     SpO2 01/26/18 0847 100 %   Weight 01/26/18 0848 195 lb (88.5 kg)     Height --      Head Circumference --      Peak Flow --      Pain Score 01/26/18 0848 3     Pain Loc --      Pain Edu? --      Excl. in GC? --    No data found.  Updated Vital Signs BP 124/87 (BP Location: Right Arm)   Pulse (!) 59   Temp 98.1 F (36.7 C) (Oral)   Resp 16   Wt 195 lb (88.5 kg)   SpO2 100%   BMI 26.45 kg/m   Visual Acuity Right Eye Distance:   Left Eye Distance:   Bilateral Distance:    Right Eye Near:   Left Eye Near:    Bilateral Near:     Physical Exam  Constitutional: He appears well-developed and well-nourished. No distress.  HENT:  Head: Normocephalic and atraumatic.  Mouth/Throat: Oropharynx is clear and moist.  Eyes: Pupils are equal, round, and reactive to light. Conjunctivae are normal.  Neck: Normal range of motion.  Cardiovascular: Normal rate.  Pulmonary/Chest: Effort normal. No respiratory distress.  Abdominal: Soft. He exhibits  no distension.  Genitourinary: Penis normal.  Genitourinary Comments: Few hypopigmented spots on shaft of penis.  No palpable deformity.  No verrucous lesion.  No discharge seen.  Musculoskeletal: Normal range of motion. He exhibits no edema.  Neurological: He is alert.  Skin: Skin is warm and dry.     UC Treatments / Results  Labs (all labs ordered are listed, but only abnormal results are displayed) Labs Reviewed  URINE CYTOLOGY ANCILLARY ONLY    EKG None  Radiology No results found.  Procedures Procedures (including critical care time)  Medications Ordered in UC Medications  azithromycin (ZITHROMAX) tablet 1,000 mg (has no administration in time range)  cefTRIAXone (ROCEPHIN) injection 250 mg (has no administration in time range)    Initial Impression / Assessment and Plan / UC Course  I have reviewed the triage vital signs and the nursing notes.  Pertinent labs & imaging results that were available during my care of the patient were reviewed by  me and considered in my medical decision making (see chart for details).      Final Clinical Impressions(s) / UC Diagnoses   Final diagnoses:  Dysuria     Discharge Instructions     We did lab testing during this visit.  If there are any abnormal findings that require change in medicine or indicate a positive result, you will be notified.  If all of your tests are normal, you will not be called.      ED Prescriptions    None     Controlled Substance Prescriptions [place] Controlled Substance Registry consulted? Not Applicable   Eustace MooreNelson, Yvonne Sue, MD 01/26/18 [phone]

## 2018-01-26 NOTE — ED Triage Notes (Signed)
PT reports yellow discharge from penis and dysuria for 1 week.

## 2018-01-26 NOTE — Discharge Instructions (Signed)
We did lab testing during this visit.  If there are any abnormal findings that require change in medicine or indicate a positive result, you will be notified.  If all of your tests are normal, you will not be called.   ° °

## 2018-01-27 LAB — URINE CYTOLOGY ANCILLARY ONLY
Chlamydia: POSITIVE — AB
NEISSERIA GONORRHEA: POSITIVE — AB
TRICH (WINDOWPATH): NEGATIVE

## 2018-01-28 ENCOUNTER — Telehealth (HOSPITAL_COMMUNITY): Payer: Self-pay

## 2018-01-28 NOTE — Telephone Encounter (Signed)
Chlamydia is positive.  This was treated at the urgent care visit with po zithromax 1g.  Need to educate pt to please refrain from sexual intercourse for 7 days to give the medicine time to work.  Sexual partners need to be notified and tested/treated.  Condoms may reduce risk of reinfection.  Recheck or followup with PCP for further evaluation if symptoms are not improving.  [facility] notified  Test for gonorrhea was positive. This was treated at the urgent care visit with IM rocephin 250mg  and po zithromax 1g. Need to educate patient to refrain from sexual intercourse for 7 days after treatment to give the medicine time to work. Sexual partners need to be notified and tested/treated. Condoms may reduce risk of reinfection. Recheck or followup with PCP for further evaluation if symptoms are not improving.[facility] notified.   No answer at this time. Voicemail left.

## 2018-02-01 ENCOUNTER — Telehealth (HOSPITAL_COMMUNITY): Payer: Self-pay

## 2018-02-01 NOTE — Telephone Encounter (Signed)
Attempted to reach patient x 2 

## 2018-02-02 ENCOUNTER — Telehealth (HOSPITAL_COMMUNITY): Payer: Self-pay

## 2018-02-02 NOTE — Telephone Encounter (Signed)
Attempted to reach patient x 3, letter sent to patient.

## 2018-06-18 ENCOUNTER — Encounter (HOSPITAL_COMMUNITY): Payer: Self-pay

## 2018-06-18 ENCOUNTER — Ambulatory Visit (HOSPITAL_COMMUNITY)
Admission: EM | Admit: 2018-06-18 | Discharge: 2018-06-18 | Disposition: A | Discharge status: Home / Self Care | Payer: Self-pay | Attending: Family Medicine | Admitting: Family Medicine

## 2018-06-18 DIAGNOSIS — R51 Headache: Secondary | ICD-10-CM

## 2018-06-18 DIAGNOSIS — A6001 Herpesviral infection of penis: Secondary | ICD-10-CM

## 2018-06-18 DIAGNOSIS — R519 Headache, unspecified: Secondary | ICD-10-CM

## 2018-06-18 MED ORDER — VALACYCLOVIR HCL 1 G PO TABS: ORAL_TABLET | ORAL | 0 refills | Status: DC

## 2018-06-18 MED ORDER — IBUPROFEN 800 MG PO TABS: 800.0000 mg | ORAL_TABLET | Freq: Three times a day (TID) | ORAL | 0 refills | Status: DC

## 2018-06-18 NOTE — ED Provider Notes (Signed)
[facility]   [id number] 06/18/18 Arrival Time: 0907  ASSESSMENT & PLAN:  1. Nonintractable headache, unspecified chronicity pattern, unspecified headache type   2. Herpes simplex infection of penis     Meds ordered this encounter  Medications  . valACYclovir (VALTREX) 1000 MG tablet    Sig: Take 2 tablets in the morning and 2 tablets in the evening for one day when needed.    Dispense:  30 tablet    Refill:  0  . ibuprofen (ADVIL,MOTRIN) 800 MG tablet    Sig: Take 1 tablet (800 mg total) by mouth 3 (three) times daily. For a headache, when needed.    Dispense:  21 tablet    Refill:  0   Headache has significantly improved. No red flag symptoms reported.  Discussed genital herpes. Does not desire un-roofing current vesicles to obtain viral culture. 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 Declines testing for other STIs at this time.  Reviewed expectations re: course of current medical issues. Questions answered. Outlined signs and symptoms indicating need for more acute intervention. Patient verbalized understanding. After Visit Summary given.   SUBJECTIVE:  COLLIN RENGEL is a 22 y.o. male who presents with complaint of a headache without aura. Reports gradual onset over the pats 2-3 days. Location: generalized without radiation. History of headaches: yes; similar symptoms. Precipitating factors include: none which have been determined. Describes as "throbbing at times then just a sore pain". By this morning headache has almost completely resolved. Associated symptoms: Preceding aura: no. Nausea/vomiting: no. Vision changes: no. Increased sensitivity to light and to noises: no. Fever: no. Sinus pressure/congestion: no. No extremity sensation changes or weakness.  Home treatment has included nothing. Current headache does not limit normal daily activities. The patient denies dizziness, loss of balance, muscle weakness, numbness of  extremities, speech difficulties and vision problems. Ambulatory without difficulty.  Also reports recurrent "rash" on his penis. Infrequently with no identifiable pattern. Describes as "small bumps" when present. Mild discomfort. No itching. First noticed between 1 and 2 years ago. Usually resolves after a few days. No OTC treatment. Sexually active with one male partner. Occasional condom use. No penile discharge or urinary symptoms.  ROS: As per HPI.   OBJECTIVE:  Vitals:   06/18/18 0926  BP: 123/81  Pulse: 70  Resp: 18  Temp: 98 F (36.7 C)  TempSrc: Oral  SpO2: 99%    General appearance: alert; no distress Eyes: PERRLA; EOMI; conjunctiva normal HENT: normocephalic; atraumatic Neck: supple with FROM Lungs: clear to auscultation bilaterally Heart: regular rate and rhythm GU: 1.5 cm crop of 1-13mm vesicles over penile shaft consistent with genital herpes Extremities: no edema; symmetrical with no gross deformities Skin: warm and dry Neurologic: CN 2-12 grossly intact; normal gait; normal symmetric reflexes; normal extremity strength and sensation throughout Psychological: alert and cooperative; normal mood and affect   No Known Allergies  PMH: Gonorrhea. Chlamydia.  Social History   Socioeconomic History  . Marital status: Single    Spouse name: Not on file  . Number of children: Not on file  . Years of education: Not on file  . Highest education level: Not on file  Occupational History  . Not on file  Social Needs  . Financial resource strain: Not on file  . Food insecurity:    Worry: Not on file    Inability: Not on file  . Transportation needs:    Medical: Not on file    Non-medical: Not on file  Tobacco Use  . Smoking status: Never Smoker  . Smokeless tobacco: Never Used  . Tobacco comment: 2 cigs a day  Substance and Sexual Activity  . Alcohol use: Yes    Alcohol/week: 0.0 standard drinks    Comment: occ.  . Drug use: Not on file  . Sexual  activity: Not on file  Lifestyle  . Physical activity:    Days per week: Not on file    Minutes per session: Not on file  . Stress: Not on file  Relationships  . Social connections:    Talks on phone: Not on file    Gets together: Not on file    Attends religious service: Not on file    Active member of club or organization: Not on file    Attends meetings of clubs or organizations: Not on file    Relationship status: Not on file  . Intimate partner violence:    Fear of current or ex partner: Not on file    Emotionally abused: Not on file    Physically abused: Not on file    Forced sexual activity: Not on file  Other Topics Concern  . Not on file  Social History Narrative  . Not on file    History reviewed. No pertinent surgical history.   Mardella LaymanHagler, Knox Cervi, MD 06/18/18 1123

## 2018-06-18 NOTE — ED Triage Notes (Signed)
Pt states that he has a rash on the shaft of his penis, He believes that the headaches is coming from the rash that is located on his penis.

## 2018-10-09 ENCOUNTER — Encounter (HOSPITAL_COMMUNITY): Payer: Self-pay | Admitting: *Deleted

## 2018-10-09 ENCOUNTER — Other Ambulatory Visit: Payer: Self-pay

## 2018-10-09 ENCOUNTER — Ambulatory Visit (HOSPITAL_COMMUNITY)
Admission: EM | Admit: 2018-10-09 | Discharge: 2018-10-09 | Disposition: A | Discharge status: Home / Self Care | Payer: Self-pay | Attending: Family Medicine | Admitting: Family Medicine

## 2018-10-09 DIAGNOSIS — J069 Acute upper respiratory infection, unspecified: Secondary | ICD-10-CM

## 2018-10-09 NOTE — Discharge Instructions (Signed)
CDC guidance provided. Tylenol 1000 mg q8.

## 2018-10-09 NOTE — ED Provider Notes (Signed)
10/09/2018 12:40 PM   DOB: June 02, 1996 / MRN: [id number]  SUBJECTIVE:  Zachary Mccarty is a 23 y.o. male presenting for mild to moderate worsening nasal congestion. Associates mild cough, sore throat, hot and cold chills.   He has No Known Allergies.   He  has no past medical history on file.    He  reports that he has been smoking. He has never used smokeless tobacco. He reports current alcohol use. He reports previous drug use. He  has no history on file for sexual activity. The patient  has no past surgical history on file.  His family history includes Healthy in his father and mother.    OBJECTIVE:  BP 120/75   Pulse 70   Temp 98.3 F (36.8 C) (Oral)   Resp 18   SpO2 100%   Wt Readings from Last 3 Encounters:  01/26/18 195 lb (88.5 kg)  06/16/17 180 lb (81.6 kg)  07/07/16 179 lb (81.2 kg) (81 %, Z= 0.87)*   * Growth percentiles are based on CDC (Boys, 2-20 Years) data.   Temp Readings from Last 3 Encounters:  10/09/18 98.3 F (36.8 C) (Oral)  06/18/18 98 F (36.7 C) (Oral)  01/26/18 98.1 F (36.7 C) (Oral)   BP Readings from Last 3 Encounters:  10/09/18 120/75  06/18/18 123/81  01/26/18 124/87   Pulse Readings from Last 3 Encounters:  10/09/18 70  06/18/18 70  01/26/18 (!) 59    Physical Exam Vitals signs and nursing note reviewed.  Constitutional:      Appearance: He is well-developed. He is not ill-appearing or diaphoretic.  Eyes:     Conjunctiva/sclera: Conjunctivae normal.     Pupils: Pupils are equal, round, and reactive to light.  Cardiovascular:     Rate and Rhythm: Normal rate and regular rhythm.     Pulses: Normal pulses.     Heart sounds: Normal heart sounds, S1 normal and S2 normal. No murmur. No friction rub. No gallop.   Pulmonary:     Effort: Pulmonary effort is normal. No respiratory distress.     Breath sounds: No stridor. No wheezing or rales.  Abdominal:     General: There is no distension.  Musculoskeletal: Normal range of  motion.  Skin:    General: Skin is warm and dry.  Neurological:     Mental Status: He is alert and oriented to person, place, and time.     Cranial Nerves: No cranial nerve deficit.     Coordination: Coordination normal.     No results found for this or any previous visit (from the past 72 hour(s)).  No results found.  ASSESSMENT AND PLAN:   Viral upper respiratory tract infection    Discharge Instructions     CDC guidance provided. Tylenol 1000 mg q8.         The patient is advised to call or return to clinic if he does not see an improvement in symptoms, or to seek the care of the closest emergency department if he worsens with the above plan.   Deliah Boston, MHS, PA-C 10/09/2018 12:40 PM   Ofilia Neas, PA-C 10/09/18 1240

## 2018-10-09 NOTE — ED Triage Notes (Signed)
C/O cough, runny nose, "getting hot" x 2 days; states briefly had a sore throat yesterday which has resolved.  Unk if fevers.

## 2018-10-18 ENCOUNTER — Ambulatory Visit (HOSPITAL_COMMUNITY)
Admission: EM | Admit: 2018-10-18 | Discharge: 2018-10-18 | Disposition: A | Discharge status: Home / Self Care | Payer: Self-pay | Attending: Family Medicine | Admitting: Family Medicine

## 2018-10-18 ENCOUNTER — Other Ambulatory Visit: Payer: Self-pay

## 2018-10-18 ENCOUNTER — Encounter (HOSPITAL_COMMUNITY): Payer: Self-pay

## 2018-10-18 DIAGNOSIS — J069 Acute upper respiratory infection, unspecified: Secondary | ICD-10-CM

## 2018-10-18 NOTE — ED Triage Notes (Signed)
Pt cc cough and sore throat this was going on for 5 days. Pt needs a note to return to work. Pt states he started feeling a little better on Saturday.

## 2018-10-26 NOTE — ED Provider Notes (Signed)
[facility]   [id number] 10/18/18 Arrival Time: 1048  ASSESSMENT & PLAN:  1. Viral upper respiratory tract infection    Symptoms improved. No fever associated with illness. Work note provided. May f/u with PCP or here as needed.  Reviewed expectations re: course of current medical issues. Questions answered. Outlined signs and symptoms indicating need for more acute intervention. Patient verbalized understanding. After Visit Summary given.   SUBJECTIVE: History from: patient.  BRENTON HOAGLIN is a 23 y.o. male who presents with complaint of nasal congestion, post-nasal drainage, and a persistent dry cough; with a mild sore throat. Onset abrupt, 1 week ago; without fatigue and without body aches. SOB: none. Wheezing: none. Fever: no. Overall normal PO intake without n/v. Known sick contacts: no. No specific or significant aggravating or alleviating factors reported. OTC treatment: none reported.  Feeling better now and needs a note so that he may return to work..  Social History   Tobacco Use  Smoking Status Current Some Day Smoker  Smokeless Tobacco Never Used  Tobacco Comment   2 cigs a day    ROS: As per HPI.   OBJECTIVE:  Vitals:   10/18/18 1110 10/18/18 1111  BP:  121/80  Pulse:  81  Resp:  18  Temp:  98.4 F (36.9 C)  TempSrc:  Oral  SpO2:  98%  Weight: 84.8 kg      General appearance: alert; appears fatigued HEENT: normal nares and oropharynx Neck: supple without LAD CV: RRR Lungs: unlabored respirations, symmetrical air entry without wheezing; cough: absent Abd: soft Ext: no LE edema Skin: warm and dry Psychological: alert and cooperative; normal mood and affect  No Known Allergies   Family History  Problem Relation Age of Onset  . Healthy Mother   . Healthy Father    Social History   Socioeconomic History  . Marital status: Single    Spouse name: Not on file  . Number of children: Not on file  . Years of education: Not  on file  . Highest education level: Not on file  Occupational History  . Not on file  Social Needs  . Financial resource strain: Not on file  . Food insecurity:    Worry: Not on file    Inability: Not on file  . Transportation needs:    Medical: Not on file    Non-medical: Not on file  Tobacco Use  . Smoking status: Current Some Day Smoker  . Smokeless tobacco: Never Used  . Tobacco comment: 2 cigs a day  Substance and Sexual Activity  . Alcohol use: Yes    Alcohol/week: 0.0 standard drinks    Comment: occasionally  . Drug use: Not Currently  . Sexual activity: Not on file  Lifestyle  . Physical activity:    Days per week: Not on file    Minutes per session: Not on file  . Stress: Not on file  Relationships  . Social connections:    Talks on phone: Not on file    Gets together: Not on file    Attends religious service: Not on file    Active member of club or organization: Not on file    Attends meetings of clubs or organizations: Not on file    Relationship status: Not on file  . Intimate partner violence:    Fear of current or ex partner: Not on file    Emotionally abused: Not on file    Physically abused: Not on file  Forced sexual activity: Not on file  Other Topics Concern  . Not on file  Social History Narrative  . Not on file           Mardella LaymanHagler, Isela Stantz, MD 10/26/18 [phone]

## 2018-12-18 ENCOUNTER — Encounter (HOSPITAL_COMMUNITY): Payer: Self-pay

## 2018-12-18 ENCOUNTER — Other Ambulatory Visit: Payer: Self-pay

## 2018-12-18 ENCOUNTER — Emergency Department (HOSPITAL_COMMUNITY)
Admission: EM | Admit: 2018-12-18 | Discharge: 2018-12-19 | Disposition: A | Discharge status: Home / Self Care | Payer: Self-pay | Attending: Emergency Medicine | Admitting: Emergency Medicine

## 2018-12-18 DIAGNOSIS — W208XXA Other cause of strike by thrown, projected or falling object, initial encounter: Secondary | ICD-10-CM | POA: Insufficient documentation

## 2018-12-18 DIAGNOSIS — Z23 Encounter for immunization: Secondary | ICD-10-CM | POA: Insufficient documentation

## 2018-12-18 DIAGNOSIS — Y93G3 Activity, cooking and baking: Secondary | ICD-10-CM | POA: Insufficient documentation

## 2018-12-18 DIAGNOSIS — Y92010 Kitchen of single-family (private) house as the place of occurrence of the external cause: Secondary | ICD-10-CM | POA: Insufficient documentation

## 2018-12-18 DIAGNOSIS — F17210 Nicotine dependence, cigarettes, uncomplicated: Secondary | ICD-10-CM | POA: Insufficient documentation

## 2018-12-18 DIAGNOSIS — Y999 Unspecified external cause status: Secondary | ICD-10-CM | POA: Insufficient documentation

## 2018-12-18 DIAGNOSIS — S61411A Laceration without foreign body of right hand, initial encounter: Secondary | ICD-10-CM | POA: Insufficient documentation

## 2018-12-18 MED ORDER — LIDOCAINE-EPINEPHRINE (PF) 1 %-1:200000 IJ SOLN
30.0000 mL | Freq: Once | INTRAMUSCULAR | Status: AC
  Administered 2018-12-18: 30 mL
  Filled 2018-12-18: qty 30

## 2018-12-18 MED ORDER — TETANUS-DIPHTH-ACELL PERTUSSIS 5-2.5-18.5 LF-MCG/0.5 IM SUSP
0.5000 mL | Freq: Once | INTRAMUSCULAR | Status: AC
  Administered 2018-12-18: 23:00:00 0.5 mL via INTRAMUSCULAR
  Filled 2018-12-18: qty 0.5

## 2018-12-18 NOTE — ED Provider Notes (Signed)
[facility] EMERGENCY DEPARTMENT Provider Note   CSN: [id number] Arrival date & time: 12/18/18  2148    History   Chief Complaint Chief Complaint  Patient presents with  . Laceration    HPI Zachary Mccarty is a 23 y.o. male.     23 year old male presents to the emergency department for evaluation of laceration.  States that he was cooking chicken when a plate sitting on the top shelf fell off and landed on his right hand.  This occurred approximately 45 minutes prior to arrival.  Bleeding has been controlled with pressure.  He is not on chronic anticoagulation.  Reports pain to the area of laceration site.  This is aggravated with movement of the hand.  No numbness or paresthesias.  Unsure of the date of his last tetanus shot.  No medications taken prior to arrival.  The history is provided by the patient. No language interpreter was used.  Laceration    History reviewed. No pertinent past medical history.  Patient Active Problem List   Diagnosis Date Noted  . Rash and nonspecific skin eruption 07/07/2016  . 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 Tetrahydrocannabinol (THC) use disorder, mild, abuse 09/29/2014  . Gynecomastia 08/10/2012  . Allergic conjunctivitis of both eyes 07/30/2011    History reviewed. No pertinent surgical history.      Home Medications    Prior to Admission medications   Medication Sig Start Date End Date Taking? Authorizing Provider  valACYclovir (VALTREX) 1000 MG tablet Take 2 tablets in the morning and 2 tablets in the evening for one day when needed. Patient not taking: Reported on 12/18/2018 06/18/18   Mardella Layman, MD    Family History Family History  Problem Relation Age of Onset  . Healthy Mother   . Healthy Father     Social History Social History   Tobacco Use  . Smoking status: Current Every Day Smoker    Packs/day: 1.00  . Smokeless tobacco: Never Used  . Tobacco comment: 2  cigs a day  Substance Use Topics  . Alcohol use: Yes    Alcohol/week: 0.0 standard drinks    Frequency: Never    Comment: occasionally  . Drug use: Not Currently     Allergies   Patient has no known allergies.   Review of Systems Review of Systems Ten systems reviewed and are negative for acute change, except as noted in the HPI.    Physical Exam Updated Vital Signs BP 119/77 (BP Location: Left Arm)   Pulse 94   Temp 98.1 F (36.7 C) (Oral)   Resp 16   SpO2 96%   Physical Exam Vitals signs and nursing note reviewed.  Constitutional:      General: He is not in acute distress.    Appearance: He is well-developed. He is not diaphoretic.     Comments: Nontoxic and in NAD  HENT:     Head: Normocephalic and atraumatic.  Eyes:     General: No scleral icterus.    Conjunctiva/sclera: Conjunctivae normal.  Neck:     Musculoskeletal: Normal range of motion.  Cardiovascular:     Rate and Rhythm: Normal rate and regular rhythm.     Pulses: Normal pulses.     Comments: Distal radial pulse 2+ in the RUE. Capillary refill brisk in all digits of the R hand. Pulmonary:     Effort: Pulmonary effort is normal. No respiratory distress.     Comments: Respirations even and  unlabored Musculoskeletal: Normal range of motion.     Right hand: He exhibits tenderness and laceration. He exhibits no bony tenderness and no deformity. Normal sensation noted. Normal strength noted.       Hands:  Skin:    General: Skin is warm and dry.     Coloration: Skin is not pale.     Findings: No erythema or rash.  Neurological:     General: No focal deficit present.     Mental Status: He is alert and oriented to person, place, and time.     Coordination: Coordination normal.     Comments: Sensation to light touch intact in the RUE. Patient able to wiggle all fingers. Finger to thumb opposition intact in the R hand.  Psychiatric:        Behavior: Behavior normal.      ED Treatments / Results   Labs (all labs ordered are listed, but only abnormal results are displayed) Labs Reviewed - No data to display  EKG None  Radiology No results found.  Procedures Procedures (including critical care time)  LACERATION REPAIR Performed by: Antony MaduraKelly Lachae Hohler Authorized by: Antony MaduraKelly Quill Grinder Consent: Verbal consent obtained. Risks and benefits: risks, benefits and alternatives were discussed Consent given by: patient Patient identity confirmed: provided demographic data Prepped and Draped in normal sterile fashion Wound explored  Laceration Location: R hand, dorsum  Laceration Length: 7cm  No Foreign Bodies seen or palpated  Anesthesia: local infiltration  Local anesthetic: lidocaine 1% with epinephrine  Anesthetic total: 10 ml  Irrigation method: syringe Amount of cleaning: standard  Skin closure: 4-0 prolene  Number of sutures: 10  Technique: simple interrupted (9), horizontal mattress (1)  Patient tolerance: Patient tolerated the procedure well with no immediate complications.   Medications Ordered in ED Medications  Tdap (BOOSTRIX) injection 0.5 mL (0.5 mLs Intramuscular Given 12/18/18 2233)  lidocaine-EPINEPHrine (XYLOCAINE-EPINEPHrine) 1 %-1:200000 (PF) injection 30 mL (30 mLs Other Given 12/18/18 2250)     Initial Impression / Assessment and Plan / ED Course  I have reviewed the triage vital signs and the nursing notes.  Pertinent labs & imaging results that were available during my care of the patient were reviewed by me and considered in my medical decision making (see chart for details).        Tdap booster given. Pressure irrigation performed. Laceration occurred < 8 hours prior to repair which was well tolerated. Pt has no comorbidities to effect normal wound healing. Discussed suture home care with pt and answered questions. Pt to follow up for wound check and suture removal in 10-14 days. Pt is hemodynamically stable with no complaints prior to discharge.      Final Clinical Impressions(s) / ED Diagnoses   Final diagnoses:  Laceration of right hand, foreign body presence unspecified, initial encounter    ED Discharge Orders    None       Antony MaduraHumes, Nakina Spatz, PA-C 12/18/18 2356    Melene PlanFloyd, Dan, DO 12/19/18 [phone]

## 2018-12-18 NOTE — ED Triage Notes (Signed)
Onset tonight pt was cooking, pounded on counter with bowl and a glass sitting on top shelf fell off and landing on pts right hand.  7 cm x 2 cm lac, blood oozing.

## 2018-12-18 NOTE — Discharge Instructions (Addendum)
Apply bacitracin over top of your wound to prevent infection.  Take ibuprofen as needed for pain.  You may use 600 mg ibuprofen every 6 hours as needed.  Have your stitches removed in 10 to 14 days.  You may return to the ED for new or concerning symptoms, especially if signs of infection develop.

## 2018-12-19 NOTE — ED Notes (Signed)
Pt unable to stay for discharge vitals. States he has to leave now due to his ride having to be to work. Patient Alert and oriented to baseline. Stable and ambulatory to baseline. Patient verbalized understanding of the discharge instructions.  Patient belongings were taken by the patient. Zachary Mccarty

## 2019-01-11 ENCOUNTER — Ambulatory Visit (HOSPITAL_COMMUNITY)
Admission: EM | Admit: 2019-01-11 | Discharge: 2019-01-11 | Disposition: A | Discharge status: Home / Self Care | Payer: Medicaid Other | Attending: Family Medicine | Admitting: Family Medicine

## 2019-01-11 ENCOUNTER — Other Ambulatory Visit: Payer: Self-pay

## 2019-01-11 ENCOUNTER — Encounter (HOSPITAL_COMMUNITY): Payer: Self-pay | Admitting: Family Medicine

## 2019-01-11 DIAGNOSIS — R369 Urethral discharge, unspecified: Secondary | ICD-10-CM | POA: Insufficient documentation

## 2019-01-11 MED ORDER — AZITHROMYCIN 250 MG PO TABS
ORAL_TABLET | ORAL | Status: AC
  Filled 2019-01-11: qty 4

## 2019-01-11 MED ORDER — CEFTRIAXONE SODIUM 250 MG IJ SOLR
250.0000 mg | Freq: Once | INTRAMUSCULAR | Status: AC
  Administered 2019-01-11: 250 mg via INTRAMUSCULAR

## 2019-01-11 MED ORDER — CEFTRIAXONE SODIUM 250 MG IJ SOLR
INTRAMUSCULAR | Status: AC
  Filled 2019-01-11: qty 250

## 2019-01-11 MED ORDER — AZITHROMYCIN 250 MG PO TABS
1000.0000 mg | ORAL_TABLET | Freq: Once | ORAL | Status: AC
  Administered 2019-01-11: 1000 mg via ORAL

## 2019-01-11 NOTE — ED Triage Notes (Signed)
Pt cc he would like to be treated for an STD. Pt states he has penile discharge.

## 2019-01-11 NOTE — ED Provider Notes (Signed)
[facility]    CSN: [id number] Arrival date & time: 01/11/19  0845     History   Chief Complaint Chief Complaint  Patient presents with  . SEXUALLY TRANSMITTED DISEASE    HPI Zachary Mccarty is a 23 y.o. male.   Patient is a 23 year old male with past medical history of HPV, gonorrhea and chlamydia.  He presents today with approximate 1 day of penile discharge.  He is also had some dysuria.  Reported that girlfriend tested positive for gonorrhea and chlamydia.  He is here for testing and treatment.  Denies any associated fever, abdominal pain, testicle pain or swelling.  Denies any penile pain or rashes.  ROS per HPI      History reviewed. No pertinent past medical history.  Patient Active Problem List   Diagnosis Date Noted  . Rash and nonspecific skin eruption 07/07/2016  . 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 Tetrahydrocannabinol (THC) use disorder, mild, abuse 09/29/2014  . Gynecomastia 08/10/2012  . Allergic conjunctivitis of both eyes 07/30/2011    History reviewed. No pertinent surgical history.     Home Medications    Prior to Admission medications   Medication Sig Start Date End Date Taking? Authorizing Provider  valACYclovir (VALTREX) 1000 MG tablet Take 2 tablets in the morning and 2 tablets in the evening for one day when needed. Patient not taking: Reported on 12/18/2018 06/18/18   Mardella LaymanHagler, Brian, MD    Family History Family History  Problem Relation Age of Onset  . Healthy Mother   . Healthy Father     Social History Social History   Tobacco Use  . Smoking status: Current Every Day Smoker    Packs/day: 1.00  . Smokeless tobacco: Never Used  . Tobacco comment: 2 cigs a day  Substance Use Topics  . Alcohol use: Yes    Alcohol/week: 0.0 standard drinks    Frequency: Never    Comment: occasionally  . Drug use: Not Currently     Allergies   Patient has no known allergies.   Review of Systems  Review of Systems   Physical Exam Triage Vital Signs ED Triage Vitals [01/11/19 0902]  Enc Vitals Group     BP 116/77     Pulse Rate 84     Resp 16     Temp 98.3 F (36.8 C)     Temp Source Oral     SpO2 98 %     Weight 185 lb (83.9 kg)     Height      Head Circumference      Peak Flow      Pain Score 0     Pain Loc      Pain Edu?      Excl. in GC?    No data found.  Updated Vital Signs BP 116/77 (BP Location: Right Arm)   Pulse 84   Temp 98.3 F (36.8 C) (Oral)   Resp 16   Wt 185 lb (83.9 kg)   SpO2 98%   BMI 25.09 kg/m   Visual Acuity Right Eye Distance:   Left Eye Distance:   Bilateral Distance:    Right Eye Near:   Left Eye Near:    Bilateral Near:     Physical Exam Vitals signs and nursing note reviewed.  Constitutional:      Appearance: Normal appearance.  HENT:     Head: Normocephalic and atraumatic.     Nose: Nose normal.  Eyes:  Conjunctiva/sclera: Conjunctivae normal.  Neck:     Musculoskeletal: Normal range of motion.  Pulmonary:     Effort: Pulmonary effort is normal.  Abdominal:     Palpations: Abdomen is soft.     Tenderness: There is no abdominal tenderness.  Musculoskeletal: Normal range of motion.  Skin:    General: Skin is warm and dry.     Findings: Bruising: no.  Neurological:     Mental Status: He is alert.  Psychiatric:        Mood and Affect: Mood normal.      UC Treatments / Results  Labs (all labs ordered are listed, but only abnormal results are displayed) Labs Reviewed  URINE CYTOLOGY ANCILLARY ONLY    EKG None  Radiology No results found.  Procedures Procedures (including critical care time)  Medications Ordered in UC Medications  cefTRIAXone (ROCEPHIN) injection 250 mg (has no administration in time range)  azithromycin (ZITHROMAX) tablet 1,000 mg (has no administration in time range)    Initial Impression / Assessment and Plan / UC Course  I have reviewed the triage vital signs and the  nursing notes.  Pertinent labs & imaging results that were available during my care of the patient were reviewed by me and considered in my medical decision making (see chart for details).     Treating prophylactically for gonorrhea and chlamydia today.  Sending urine for cytology Labs pending Final Clinical Impressions(s) / UC Diagnoses   Final diagnoses:  Penile discharge     Discharge Instructions     Treating you for gonorrhea and chlamydia today.  Sending your urine for STD testing.  We will call you with any positive results Please refrain from sexual activity for at least 7 days or more until you to resolve.    ED Prescriptions    None     Controlled Substance Prescriptions [place] Controlled Substance Registry consulted?    Orvan July, NP 01/11/19 [phone]

## 2019-01-11 NOTE — Discharge Instructions (Addendum)
Treating you for gonorrhea and chlamydia today.  Sending your urine for STD testing.  We will call you with any positive results Please refrain from sexual activity for at least 7 days or more until you to resolve.

## 2019-01-12 LAB — URINE CYTOLOGY ANCILLARY ONLY
Chlamydia: NEGATIVE
Neisseria Gonorrhea: NEGATIVE
Trichomonas: NEGATIVE

## 2019-01-20 ENCOUNTER — Encounter (HOSPITAL_COMMUNITY): Payer: Self-pay | Admitting: Emergency Medicine

## 2019-01-20 ENCOUNTER — Other Ambulatory Visit: Payer: Self-pay

## 2019-01-20 ENCOUNTER — Ambulatory Visit (HOSPITAL_COMMUNITY)
Admission: EM | Admit: 2019-01-20 | Discharge: 2019-01-20 | Disposition: A | Discharge status: Home / Self Care | Payer: Self-pay | Attending: Internal Medicine | Admitting: Internal Medicine

## 2019-01-20 DIAGNOSIS — Z87438 Personal history of other diseases of male genital organs: Secondary | ICD-10-CM

## 2019-01-20 DIAGNOSIS — R369 Urethral discharge, unspecified: Secondary | ICD-10-CM

## 2019-01-20 LAB — POCT URINALYSIS DIP (DEVICE)
Bilirubin Urine: NEGATIVE
Glucose, UA: NEGATIVE mg/dL
Hgb urine dipstick: NEGATIVE
Ketones, ur: NEGATIVE mg/dL
Nitrite: NEGATIVE
Protein, ur: NEGATIVE mg/dL
Specific Gravity, Urine: 1.025 (ref 1.005–1.030)
Urobilinogen, UA: 4 mg/dL — ABNORMAL HIGH (ref 0.0–1.0)
pH: 7 (ref 5.0–8.0)

## 2019-01-20 NOTE — ED Provider Notes (Signed)
[facility]    CSN: [id number] Arrival date & time: 01/20/19  1512     History   Chief Complaint Chief Complaint  Patient presents with  . Follow-up    HPI Zachary Mccarty is a 23 y.o. male with no past medical history comes to urgent care with complaints of minor discharge from the penis.  Patient was screened for STD about a week ago.  STD screen was unremarkable patient was empirically treated with ceftriaxone and Zithromax.  He comes in with concern about the penile discharge.  Discharge is noncolored.  No irritation.  It is scant.  No rashes.  No penile pain.  No scrotal or groin pain.  No fever or chills.  No flank pain.  No dysuria urgency or frequency.  HPI  History reviewed. No pertinent past medical history.  Patient Active Problem List   Diagnosis Date Noted  . Rash and nonspecific skin eruption 07/07/2016  . 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 Tetrahydrocannabinol (THC) use disorder, mild, abuse 09/29/2014  . Gynecomastia 08/10/2012  . Allergic conjunctivitis of both eyes 07/30/2011    History reviewed. No pertinent surgical history.     Home Medications    Prior to Admission medications   Medication Sig Start Date End Date Taking? Authorizing Provider  valACYclovir (VALTREX) 1000 MG tablet Take 2 tablets in the morning and 2 tablets in the evening for one day when needed. Patient not taking: Reported on 12/18/2018 06/18/18   Mardella LaymanHagler, Brian, MD    Family History Family History  Problem Relation Age of Onset  . Healthy Mother   . Healthy Father     Social History Social History   Tobacco Use  . Smoking status: Current Every Day Smoker    Packs/day: 1.00  . Smokeless tobacco: Never Used  . Tobacco comment: 2 cigs a day  Substance Use Topics  . Alcohol use: Yes    Alcohol/week: 0.0 standard drinks    Frequency: Never    Comment: occasionally  . Drug use: Not Currently     Allergies   Patient has no  known allergies.   Review of Systems Review of Systems  HENT: Negative.   Respiratory: Negative.   Cardiovascular: Negative.   Gastrointestinal: Negative.   Genitourinary: Positive for discharge. Negative for dysuria, flank pain, frequency, genital sores, hematuria, penile pain, penile swelling, scrotal swelling and urgency.  Musculoskeletal: Negative.   Skin: Negative.   Neurological: Negative.   Psychiatric/Behavioral: Negative.      Physical Exam Triage Vital Signs ED Triage Vitals [01/20/19 1547]  Enc Vitals Group     BP (!) 142/88     Pulse Rate 72     Resp 12     Temp 99.5 F (37.5 C)     Temp Source Oral     SpO2 97 %     Weight      Height      Head Circumference      Peak Flow      Pain Score      Pain Loc      Pain Edu?      Excl. in GC?    No data found.  Updated Vital Signs BP (!) 142/88 (BP Location: Right Arm)   Pulse 72   Temp 99.5 F (37.5 C) (Oral)   Resp 12   SpO2 97%   Visual Acuity Right Eye Distance:   Left Eye Distance:   Bilateral Distance:  Right Eye Near:   Left Eye Near:    Bilateral Near:     Physical Exam Constitutional:      General: He is not in acute distress.    Appearance: He is not ill-appearing or toxic-appearing.  Cardiovascular:     Heart sounds: Normal heart sounds.  Abdominal:     General: Bowel sounds are normal.     Palpations: Abdomen is soft.  Genitourinary:    Penis: Normal.      Scrotum/Testes: Normal.     Rectum: Guaiac result negative.  Skin:    General: Skin is warm.     Capillary Refill: Capillary refill takes less than 2 seconds.     Coloration: Skin is not jaundiced.     Findings: No bruising or lesion.  Neurological:     General: No focal deficit present.     Mental Status: He is alert and oriented to person, place, and time.      UC Treatments / Results  Labs (all labs ordered are listed, but only abnormal results are displayed) Labs Reviewed  POCT URINALYSIS DIP (DEVICE) -  Abnormal; Notable for the following components:      Result Value   Urobilinogen, UA 4.0 (*)    Leukocytes,Ua TRACE (*)    All other components within normal limits    EKG   Radiology No results found.  Procedures Procedures (including critical care time)  Medications Ordered in UC Medications - No data to display  Initial Impression / Assessment and Plan / UC Course  I have reviewed the triage vital signs and the nursing notes.  Pertinent labs & imaging results that were available during my care of the patient were reviewed by me and considered in my medical decision making (see chart for details).     1.  Penile discharge: Urinalysis is negative for UTI Patient was given reassurance that the scant penile discharge will resolve.  It is not symptomatic.  Patient has been empirically treated a week before.  Patient verbalized understanding.  Should he become symptomatic Final Clinical Impressions(s) / UC Diagnoses   Final diagnoses:  History of penile discharge   Discharge Instructions   None    ED Prescriptions    None     Controlled Substance Prescriptions [place] Controlled Substance Registry consulted? No   Chase Picket, MD 01/24/19 1344

## 2019-01-20 NOTE — ED Triage Notes (Signed)
Pt was seen here 9 days ago and treated empirically for STD's with Rocephin and Zithromax.  Pt states all his tests were negative.  He is still having penile discharge with no other symptoms.

## 2019-05-18 ENCOUNTER — Encounter (HOSPITAL_COMMUNITY): Payer: Self-pay | Admitting: Emergency Medicine

## 2019-05-18 ENCOUNTER — Other Ambulatory Visit: Payer: Self-pay

## 2019-05-18 ENCOUNTER — Ambulatory Visit (HOSPITAL_COMMUNITY)
Admission: EM | Admit: 2019-05-18 | Discharge: 2019-05-18 | Disposition: A | Discharge status: Home / Self Care | Payer: No Typology Code available for payment source | Attending: Urgent Care | Admitting: Urgent Care

## 2019-05-18 DIAGNOSIS — Z113 Encounter for screening for infections with a predominantly sexual mode of transmission: Secondary | ICD-10-CM | POA: Diagnosis not present

## 2019-05-18 DIAGNOSIS — A539 Syphilis, unspecified: Secondary | ICD-10-CM | POA: Insufficient documentation

## 2019-05-18 DIAGNOSIS — Z7251 High risk heterosexual behavior: Secondary | ICD-10-CM | POA: Insufficient documentation

## 2019-05-18 DIAGNOSIS — Z20828 Contact with and (suspected) exposure to other viral communicable diseases: Secondary | ICD-10-CM | POA: Diagnosis not present

## 2019-05-18 LAB — RPR: RPR Ser Ql: NONREACTIVE

## 2019-05-18 LAB — HIV ANTIBODY (ROUTINE TESTING W REFLEX): HIV Screen 4th Generation wRfx: NONREACTIVE

## 2019-05-18 MED ORDER — PENICILLIN G BENZATHINE 1200000 UNIT/2ML IM SUSP
INTRAMUSCULAR | Status: AC
  Filled 2019-05-18: qty 4

## 2019-05-18 MED ORDER — PENICILLIN G BENZATHINE 1200000 UNIT/2ML IM SUSP
2.4000 10*6.[IU] | Freq: Once | INTRAMUSCULAR | Status: AC
  Administered 2019-05-18: 09:00:00 2.4 10*6.[IU] via INTRAMUSCULAR

## 2019-05-18 NOTE — ED Triage Notes (Signed)
Patient denies any symptoms, bt states girlfriend tested positive for syphilis

## 2019-05-18 NOTE — ED Provider Notes (Signed)
  MRN: [id number] DOB: 03-14-1996  Subjective:   Zachary Mccarty is a 23 y.o. male presenting for exposure to syphilis.  Patient states that his girlfriend tested positive last week.  Reports that he had a sore that scabbed over and healed last week as well.  The start was not painful and was on his penis.  Currently denies dysuria, hematuria, urinary frequency, penile discharge, penile swelling, testicular pain, testicular swelling, anal pain, groin pain.   No current facility-administered medications for this encounter.   Current Outpatient Medications:  .  valACYclovir (VALTREX) 1000 MG tablet, Take 2 tablets in the morning and 2 tablets in the evening for one day when needed. (Patient not taking: Reported on 12/18/2018), Disp: 30 tablet, Rfl: 0   No Known Allergies  History reviewed. No pertinent past medical history.   History reviewed. No pertinent surgical history.  ROS  Objective:   Vitals: BP 121/85 (BP Location: Left Arm)   Pulse 72   Temp 98.2 F (36.8 C) (Other (Comment))   Resp 16   SpO2 97%   Physical Exam Constitutional:      Appearance: Normal appearance. He is well-developed and normal weight.  HENT:     Head: Normocephalic and atraumatic.     Right Ear: External ear normal.     Left Ear: External ear normal.     Nose: Nose normal.     Mouth/Throat:     Pharynx: Oropharynx is clear.  Eyes:     Extraocular Movements: Extraocular movements intact.     Pupils: Pupils are equal, round, and reactive to light.  Cardiovascular:     Rate and Rhythm: Normal rate.  Pulmonary:     Effort: Pulmonary effort is normal.  Genitourinary:    Penis: Circumcised. No phimosis, paraphimosis, hypospadias, erythema, tenderness, discharge, swelling or lesions.   Neurological:     Mental Status: He is alert and oriented to person, place, and time.  Psychiatric:        Mood and Affect: Mood normal.        Behavior: Behavior normal.      Assessment and Plan :   1.  Syphilis   2. Unprotected sex     We will treat patient empirically for syphilis with 2,400,000 units of penicillin.  STI panel pending.  Counseled patient on safe sex practices including avoiding having sex for at least a week to make sure he clears his infection. Counseled patient on potential for adverse effects with medications prescribed/recommended today, ER and return-to-clinic precautions discussed, patient verbalized understanding.    Jaynee Eagles, [place] 05/18/19 [phone]

## 2019-05-18 NOTE — Discharge Instructions (Signed)
Avoid all forms of sexual intercourse (oral, vaginal, anal) for the next 7 days to avoid spreading/reinfecting. Return if symptoms worsen/do not resolve, you develop fever, abdominal pain, blood in your urine, or are re-exposed to an STI.  

## 2019-05-19 LAB — CYTOLOGY, (ORAL, ANAL, URETHRAL) ANCILLARY ONLY
Chlamydia: NEGATIVE
Neisseria Gonorrhea: NEGATIVE
Trichomonas: POSITIVE — AB

## 2019-05-20 ENCOUNTER — Telehealth (HOSPITAL_COMMUNITY): Payer: Self-pay | Admitting: Emergency Medicine

## 2019-05-20 MED ORDER — METRONIDAZOLE 500 MG PO TABS: 2000.0000 mg | ORAL_TABLET | Freq: Once | ORAL | 0 refills | Status: AC

## 2019-05-20 NOTE — Telephone Encounter (Signed)
Patient returned my call, he states that he did not have any sexual contact with the person who was positive for syphilis. Pt informed that if he had recent sexual contact with them, he may need to return in 2-4 weeks for retesting. Pt verbalized understanding, all questions answered. Will pick up the medication for trichomonas today.

## 2019-05-20 NOTE — Telephone Encounter (Signed)
Trichomonas is positive. Rx  for Flagyl 2 grams, once was sent to the pharmacy of record. Pt needs education to refrain from sexual intercourse for 7 days to give the medicine time to work. Sexual partners need to be notified and tested/treated. Condoms may reduce risk of reinfection. Recheck for further evaluation if symptoms are not improving.   Pt also was treated for syphilis during visit due to possible exposure, due to recent exposure, patient might need to return in 2-4 weeks for repeat testing to confirm.  Attempted to reach patient. No answer at this time. No voicemail set up.

## 2019-05-23 ENCOUNTER — Telehealth (HOSPITAL_COMMUNITY): Payer: Self-pay | Admitting: Emergency Medicine

## 2019-05-23 MED ORDER — METRONIDAZOLE 500 MG PO TABS: 2000.0000 mg | ORAL_TABLET | Freq: Once | ORAL | 0 refills | Status: AC
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
## 2019-07-25 ENCOUNTER — Other Ambulatory Visit: Payer: Self-pay

## 2019-07-25 ENCOUNTER — Encounter (HOSPITAL_COMMUNITY): Payer: Self-pay

## 2019-07-25 ENCOUNTER — Ambulatory Visit (HOSPITAL_COMMUNITY)
Admission: EM | Admit: 2019-07-25 | Discharge: 2019-07-25 | Disposition: A | Discharge status: Home / Self Care | Payer: No Typology Code available for payment source | Attending: Family Medicine | Admitting: Family Medicine

## 2019-07-25 DIAGNOSIS — H1032 Unspecified acute conjunctivitis, left eye: Secondary | ICD-10-CM

## 2019-07-25 MED ORDER — OLOPATADINE HCL 0.1 % OP SOLN: 1.0000 [drp] | Freq: Two times a day (BID) | OPHTHALMIC | 12 refills | Status: DC

## 2019-07-25 NOTE — ED Provider Notes (Signed)
[facility]   [id number] 07/25/19 Arrival Time: 6283  ASSESSMENT & PLAN:  1. Acute conjunctivitis of left eye, unspecified acute conjunctivitis type     Meds ordered this encounter  Medications  . olopatadine (PATANOL) 0.1 % ophthalmic solution    Sig: Place 1 drop into the left eye 2 (two) times daily.    Dispense:  5 mL    Refill:  12    Discussed the diagnosis and proper care of conjunctivitis.  Stressed household Nurse, mental health. Ophthalmic drops per orders. Warm compress to eye(s). Local eye care discussed. Work note given.  Follow-up Information    [facility].   Specialty: Urgent Care Why: If worsening or failing to improve as anticipated. Contact information: [place] [phone]          Reviewed expectations re: course of current medical issues. Questions answered. Outlined signs and symptoms indicating need for more acute intervention. Patient verbalized understanding. After Visit Summary given.   SUBJECTIVE:  Zachary Mccarty is a 24 y.o. male who presents with complaint of persistent left eye "irritation and redness".  Onset abrupt, approximately 3 days ago. Not worsening. Slightly better today. No light sensitivity. Injury: no. Visual changes: no. Contact lens use: no. Recent illness: no. Self treatment: none reported.  ROS: As per HPI. All other systems negative.   OBJECTIVE:  Vitals:   07/25/19 1013  BP: 127/83  Pulse: 76  Resp: 18  Temp: 98 F (36.7 C)  TempSrc: Oral  SpO2: 100%    General appearance: alert; no distress HEENT: [place]; AT; PERRLA; EOMI OS: without reported pain; with diffuse conjunctival injection; with mild watery drainage; without corneal opacities; without limbal flush; without periorbital swelling or erythema OD: without reported pain; without conjunctival injection; without drainage; without corneal opacities; without limbal flush;  without periorbital swelling or erythema Neck: supple without LAD Lungs: clear to auscultation bilaterally; unlabored respirations Heart: regular rate and rhythm Skin: warm and dry Psychological: alert and cooperative; normal mood and affect   No Known Allergies  Social History   Socioeconomic History  . Marital status: Single    Spouse name: Not on file  . Number of children: Not on file  . Years of education: Not on file  . Highest education level: Not on file  Occupational History  . Not on file  Tobacco Use  . Smoking status: Current Every Day Smoker    Packs/day: 1.00  . Smokeless tobacco: Never Used  . Tobacco comment: 2 cigs a day  Substance and Sexual Activity  . Alcohol use: Yes    Alcohol/week: 0.0 standard drinks    Comment: occasionally  . Drug use: Not Currently  . Sexual activity: Not on file  Other Topics Concern  . Not on file  Social History Narrative  . Not on file   Social Determinants of Health   Financial Resource Strain:   . Difficulty of Paying Living Expenses: Not on file  Food Insecurity:   . Worried About Charity fundraiser in the Last Year: Not on file  . Ran Out of Food in the Last Year: Not on file  Transportation Needs:   . Lack of Transportation (Medical): Not on file  . Lack of Transportation (Non-Medical): Not on file  Physical Activity:   . Days of Exercise per Week: Not on file  . Minutes of Exercise per Session: Not on file  Stress:   . Feeling of  Stress : Not on file  Social Connections:   . Frequency of Communication with Friends and Family: Not on file  . Frequency of Social Gatherings with Friends and Family: Not on file  . Attends Religious Services: Not on file  . Active Member of Clubs or Organizations: Not on file  . Attends Banker Meetings: Not on file  . Marital Status: Not on file  Intimate Partner Violence:   . Fear of Current or Ex-Partner: Not on file  . Emotionally Abused: Not on file  .  Physically Abused: Not on file  . Sexually Abused: Not on file   Family History  Problem Relation Age of Onset  . Healthy Mother   . Healthy Father    History reviewed. No pertinent surgical history.   Mardella Layman, MD 07/25/19 1039

## 2019-07-25 NOTE — ED Triage Notes (Signed)
Pt presents with pink eye and irritation in left eye since Friday.

## 2019-09-29 ENCOUNTER — Other Ambulatory Visit: Payer: Self-pay

## 2019-09-29 ENCOUNTER — Ambulatory Visit (HOSPITAL_COMMUNITY)
Admission: EM | Admit: 2019-09-29 | Discharge: 2019-09-29 | Disposition: A | Discharge status: Home / Self Care | Payer: No Typology Code available for payment source | Attending: Family Medicine | Admitting: Family Medicine

## 2019-09-29 ENCOUNTER — Encounter (HOSPITAL_COMMUNITY): Payer: Self-pay

## 2019-09-29 DIAGNOSIS — F17210 Nicotine dependence, cigarettes, uncomplicated: Secondary | ICD-10-CM | POA: Diagnosis not present

## 2019-09-29 DIAGNOSIS — Z20822 Contact with and (suspected) exposure to covid-19: Secondary | ICD-10-CM | POA: Diagnosis not present

## 2019-09-29 DIAGNOSIS — J029 Acute pharyngitis, unspecified: Secondary | ICD-10-CM | POA: Diagnosis not present

## 2019-09-29 LAB — POCT RAPID STREP A: Streptococcus, Group A Screen (Direct): NEGATIVE

## 2019-09-29 NOTE — ED Triage Notes (Signed)
Pt states he has sore throat this started yesterday.

## 2019-09-29 NOTE — Discharge Instructions (Signed)
Strep test is negative.  This is not a strep throat The Covid test will be available later today If the strep test and Covid test are both negative you can go back to work in a couple days If the Covid test would have been the, positive, we will have to give you new work note to be off from work for 10 days Call for questions or problems

## 2019-09-29 NOTE — ED Provider Notes (Signed)
[facility]    CSN: [id number] Arrival date & time: 09/29/19  4098      History   Chief Complaint Chief Complaint  Patient presents with  . Sore Throat    HPI CYPHER PAULE is a 24 y.o. male.   HPI  Patient states that he felt well yesterday.  He has had no known exposure to infectious disease such as strep throat or coronavirus.  He works in a [employer].  No one else in his family is sick.  He woke up this morning with a painful sore throat.  He states he can hardly swallow.  No fever or chills.  No headache.  No abdominal pain or nausea.  No rash  History reviewed. No pertinent past medical history.  Patient Active Problem List   Diagnosis Date Noted  . Rash and nonspecific skin eruption 07/07/2016  . 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 Tetrahydrocannabinol (THC) use disorder, mild, abuse 09/29/2014  . Gynecomastia 08/10/2012  . Allergic conjunctivitis of both eyes 07/30/2011    History reviewed. No pertinent surgical history.     Home Medications    Prior to Admission medications   Medication Sig Start Date End Date Taking? Authorizing Provider  olopatadine (PATANOL) 0.1 % ophthalmic solution Place 1 drop into the left eye 2 (two) times daily. 07/25/19   Mardella Layman, MD  valACYclovir (VALTREX) 1000 MG tablet Take 2 tablets in the morning and 2 tablets in the evening for one day when needed. Patient not taking: Reported on 12/18/2018 06/18/18   Mardella Layman, MD    Family History Family History  Problem Relation Age of Onset  . Healthy Mother   . Healthy Father     Social History Social History   Tobacco Use  . Smoking status: Current Every Day Smoker    Packs/day: 1.00  . Smokeless tobacco: Never Used  . Tobacco comment: 2 cigs a day  Substance Use Topics  . Alcohol use: Yes    Alcohol/week: 0.0 standard drinks    Comment: occasionally  . Drug use: Not Currently     Allergies   Patient has no known  allergies.   Review of Systems Review of Systems  Constitutional: Negative for chills and fever.  HENT: Positive for sore throat. Negative for congestion.   Gastrointestinal: Negative for abdominal pain.  Neurological: Negative for headaches.     Physical Exam Triage Vital Signs ED Triage Vitals  Enc Vitals Group     BP 09/29/19 0836 127/64     Pulse Rate 09/29/19 0836 97     Resp 09/29/19 0836 16     Temp 09/29/19 0836 98.8 F (37.1 C)     Temp Source 09/29/19 0836 Oral     SpO2 09/29/19 0836 100 %     Weight 09/29/19 0837 185 lb (83.9 kg)     Height --      Head Circumference --      Peak Flow --      Pain Score 09/29/19 0837 7     Pain Loc --      Pain Edu? --      Excl. in GC? --    No data found.  Updated Vital Signs BP 127/64 (BP Location: Right Arm)   Pulse 97   Temp 98.8 F (37.1 C) (Oral)   Resp 16   Wt 83.9 kg   SpO2 100%   BMI 25.09 kg/m      Physical Exam  Constitutional:      General: He is not in acute distress.    Appearance: He is well-developed and normal weight. He is not ill-appearing.  HENT:     Head: Normocephalic and atraumatic.     Mouth/Throat:     Pharynx: Posterior oropharyngeal erythema present. No uvula swelling.     Tonsils: Tonsillar exudate present. 3+ on the right. 3+ on the left.     Comments: Tonsils are enlarged, erythematous.  Scant exudate Eyes:     Conjunctiva/sclera: Conjunctivae normal.     Pupils: Pupils are equal, round, and reactive to light.  Cardiovascular:     Rate and Rhythm: Normal rate and regular rhythm.     Heart sounds: Normal heart sounds.  Pulmonary:     Effort: Pulmonary effort is normal. No respiratory distress.  Abdominal:     General: There is no distension.     Palpations: Abdomen is soft.  Musculoskeletal:        General: Normal range of motion.     Cervical back: Normal range of motion.  Lymphadenopathy:     Cervical: No cervical adenopathy.  Skin:    General: Skin is warm and dry.    Neurological:     Mental Status: He is alert.  Psychiatric:        Mood and Affect: Mood normal.        Behavior: Behavior normal.      UC Treatments / Results  Labs (all labs ordered are listed, but only abnormal results are displayed) Labs Reviewed  SARS CORONAVIRUS 2 (TAT 6-24 HRS)  CULTURE, GROUP A STREP [facility])  POCT RAPID STREP A    EKG   Radiology No results found.  Procedures Procedures (including critical care time)  Medications Ordered in UC Medications - No data to display  Initial Impression / Assessment and Plan / UC Course  I have reviewed the triage vital signs and the nursing notes.  Pertinent labs & imaging results that were available during my care of the patient were reviewed by me and considered in my medical decision making (see chart for details).     Viral pharyngitis.  Discussed symptomatic care.  Discussed quarantine waiting for test result coronavirus.  He will need to be out of work for 10 days if Covid positive. Final Clinical Impressions(s) / UC Diagnoses   Final diagnoses:  Viral pharyngitis     Discharge Instructions     Strep test is negative.  This is not a strep throat The Covid test will be available later today If the strep test and Covid test are both negative you can go back to work in a couple days If the Covid test would have been the, positive, we will have to give you new work note to be off from work for 10 days Call for questions or problems    ED Prescriptions    None     PDMP not reviewed this encounter.   Raylene Everts, MD 09/29/19 1346

## 2019-09-30 LAB — SARS CORONAVIRUS 2 (TAT 6-24 HRS): SARS Coronavirus 2: NEGATIVE

## 2019-10-01 ENCOUNTER — Ambulatory Visit (HOSPITAL_COMMUNITY)
Admission: EM | Admit: 2019-10-01 | Discharge: 2019-10-01 | Disposition: A | Discharge status: Home / Self Care | Payer: No Typology Code available for payment source | Attending: Urgent Care | Admitting: Urgent Care

## 2019-10-01 ENCOUNTER — Encounter (HOSPITAL_COMMUNITY): Payer: Self-pay | Admitting: *Deleted

## 2019-10-01 ENCOUNTER — Other Ambulatory Visit: Payer: Self-pay

## 2019-10-01 DIAGNOSIS — R1310 Dysphagia, unspecified: Secondary | ICD-10-CM | POA: Diagnosis not present

## 2019-10-01 DIAGNOSIS — R509 Fever, unspecified: Secondary | ICD-10-CM

## 2019-10-01 DIAGNOSIS — R52 Pain, unspecified: Secondary | ICD-10-CM | POA: Diagnosis not present

## 2019-10-01 DIAGNOSIS — R110 Nausea: Secondary | ICD-10-CM

## 2019-10-01 DIAGNOSIS — R070 Pain in throat: Secondary | ICD-10-CM | POA: Diagnosis not present

## 2019-10-01 DIAGNOSIS — J029 Acute pharyngitis, unspecified: Secondary | ICD-10-CM

## 2019-10-01 LAB — CULTURE, GROUP A STREP (THRC)

## 2019-10-01 MED ORDER — PREDNISONE 20 MG PO TABS: ORAL_TABLET | ORAL | 0 refills | Status: DC

## 2019-10-01 MED ORDER — ACETAMINOPHEN 325 MG PO TABS
ORAL_TABLET | ORAL | Status: AC
  Filled 2019-10-01: qty 2

## 2019-10-01 MED ORDER — CEFTRIAXONE SODIUM 1 G IJ SOLR
1.0000 g | Freq: Once | INTRAMUSCULAR | Status: AC
  Administered 2019-10-01: 1 g via INTRAMUSCULAR

## 2019-10-01 MED ORDER — LIDOCAINE HCL (PF) 1 % IJ SOLN
INTRAMUSCULAR | Status: AC
  Filled 2019-10-01: qty 2

## 2019-10-01 MED ORDER — ONDANSETRON HCL 4 MG/2ML IJ SOLN
INTRAMUSCULAR | Status: AC
  Filled 2019-10-01: qty 2

## 2019-10-01 MED ORDER — CLINDAMYCIN HCL 300 MG PO CAPS: 300.0000 mg | ORAL_CAPSULE | Freq: Three times a day (TID) | ORAL | 0 refills | Status: DC

## 2019-10-01 MED ORDER — ACETAMINOPHEN 325 MG PO TABS
650.0000 mg | ORAL_TABLET | Freq: Once | ORAL | Status: AC
  Administered 2019-10-01: 650 mg via ORAL

## 2019-10-01 MED ORDER — ONDANSETRON 8 MG PO TBDP: 8.0000 mg | ORAL_TABLET | Freq: Three times a day (TID) | ORAL | 0 refills | Status: DC | PRN

## 2019-10-01 MED ORDER — ONDANSETRON HCL 4 MG/2ML IJ SOLN
4.0000 mg | Freq: Once | INTRAMUSCULAR | Status: AC
  Administered 2019-10-01: 4 mg via INTRAMUSCULAR

## 2019-10-01 MED ORDER — CEFTRIAXONE SODIUM 1 G IJ SOLR
INTRAMUSCULAR | Status: AC
  Filled 2019-10-01: qty 10

## 2019-10-01 NOTE — ED Provider Notes (Signed)
[facility]   MRN: [id number] DOB: 1995-08-09  Subjective:   MONROE QIN is a 24 y.o. male presenting for recheck on worsening symptoms.  Was last seen on 09/28/2019, tested negative for COVID-19 and had negative strep and culture testing.  He has since gotten worse including fever, chills, body aches, worsening throat pain and some difficulty swallowing.  Feels the need to continue to spit out his saliva.  He is having a difficult time staying hydrated and eating food.  Has had some nausea.  No current facility-administered medications for this encounter.  Current Outpatient Medications:  .  olopatadine (PATANOL) 0.1 % ophthalmic solution, Place 1 drop into the left eye 2 (two) times daily., Disp: 5 mL, Rfl: 12 .  valACYclovir (VALTREX) 1000 MG tablet, Take 2 tablets in the morning and 2 tablets in the evening for one day when needed. (Patient not taking: Reported on 12/18/2018), Disp: 30 tablet, Rfl: 0   No Known Allergies  History reviewed. No pertinent past medical history.   History reviewed. No pertinent surgical history.  Family History  Problem Relation Age of Onset  . Healthy Mother   . Healthy Father     Social History   Tobacco Use  . Smoking status: Current Every Day Smoker    Packs/day: 1.00    Types: Cigarettes  . Smokeless tobacco: Never Used  . Tobacco comment: 2 cigs a day  Substance Use Topics  . Alcohol use: Yes    Comment: occasionally  . Drug use: Not Currently    ROS   Objective:   Vitals: BP 124/74   Pulse (!) 106   Temp (!) 102.3 F (39.1 C) (Oral)   Resp 18   SpO2 100%   Physical Exam Constitutional:      General: He is not in acute distress.    Appearance: Normal appearance. He is well-developed. He is not ill-appearing, toxic-appearing or diaphoretic.  HENT:     Head: Normocephalic and atraumatic.     Right Ear: External ear normal.     Left Ear: External ear normal.     Nose: Nose normal.     Mouth/Throat:   Pharynx: Uvula midline. Pharyngeal swelling (2+ left side, 1+ right side) and posterior oropharyngeal erythema present. No oropharyngeal exudate or uvula swelling.     Tonsils: No tonsillar exudate.     Comments: Airway is patent. No muffled voice.  Eyes:     General: No scleral icterus.       Right eye: No discharge.        Left eye: No discharge.     Extraocular Movements: Extraocular movements intact.     Pupils: Pupils are equal, round, and reactive to light.  Cardiovascular:     Rate and Rhythm: Normal rate and regular rhythm.     Heart sounds: Normal heart sounds. No murmur. No friction rub. No gallop.   Pulmonary:     Effort: Pulmonary effort is normal. No respiratory distress.     Breath sounds: Normal breath sounds. No stridor. No wheezing, rhonchi or rales.  Musculoskeletal:     Cervical back: Normal range of motion and neck supple. Tenderness (underside of left jaw) present.  Skin:    General: Skin is warm and dry.  Neurological:     Mental Status: He is alert and oriented to person, place, and time.  Psychiatric:        Mood and Affect: Mood normal.        Behavior: Behavior  normal.        Thought Content: Thought content normal.        Judgment: Judgment normal.     Recent Results (from the past 2160 hour(s))  SARS CORONAVIRUS 2 (TAT 6-24 HRS) Nasopharyngeal Nasopharyngeal Swab     Status: None   Collection Time: 09/29/19  8:40 AM   Specimen: Nasopharyngeal Swab  Result Value Ref Range   SARS Coronavirus 2 NEGATIVE NEGATIVE    Comment: (NOTE) SARS-CoV-2 target nucleic acids are NOT DETECTED. The SARS-CoV-2 RNA is generally detectable in upper and lower respiratory specimens during the acute phase of infection. Negative results do not preclude SARS-CoV-2 infection, do not rule out co-infections with other pathogens, and should not be used as the sole basis for treatment or other patient management decisions. Negative results must be combined with clinical  observations, patient history, and epidemiological information. The expected result is Negative. Fact Sheet for Patients: SugarRoll.be Fact Sheet for Healthcare Providers: https://www.woods-mathews.com/ This test is not yet approved or cleared by the [place] FDA and  has been authorized for detection and/or diagnosis of SARS-CoV-2 by FDA under an Emergency Use Authorization (EUA). This EUA will remain  in effect (meaning this test can be used) for the duration of the COVID-19 declaration under Section 56 4(b)(1) of the Act, 21 U.S.C. section 360bbb-3(b)(1), unless the authorization is terminated or revoked sooner. Performed at [facility], [place] [address] [place], [place] [address]   POCT rapid strep [facility])     Status: None   Collection Time: 09/29/19  9:06 AM  Result Value Ref Range   Streptococcus, Group A Screen (Direct) NEGATIVE NEGATIVE  Culture, group A strep (throat)     Status: None   Collection Time: 09/29/19  9:18 AM   Specimen: Throat  Result Value Ref Range   Specimen Description THROAT    Special Requests NONE    Culture      NO GROUP A STREP (S.PYOGENES) ISOLATED Performed at [facility], [place] [address], [place], [place] [address]    Report Status 10/01/2019 FINAL      Assessment and Plan :   1. Pharyngitis, unspecified etiology   2. Fever, unspecified   3. Throat pain   4. Body aches   5. Dysphagia, unspecified type     Had extensive discussion with patient regarding differential which includes retropharyngeal abscess or systemic infection from his worsening pharyngitis.  Patient is refusing to report to the emergency room.  Counseled on the risks of above differential.  He verbalizes understanding.  Patient given IM ceftriaxone in clinic, IM Zofran.  He is to start oral prednisone course to help with inflammation, clindamycin to address his throat infection.  Schedule Tylenol for  fevers, aches and pains.  Use Zofran for supportive care at home.  Patient is agreeable to report to the emergency room later tonight if he continues to have symptoms. Counseled patient on potential for adverse effects with medications prescribed today, patient verbalized understanding.    Jaynee Eagles, PA-C 10/01/19 1230

## 2019-10-01 NOTE — ED Triage Notes (Signed)
Pt was seen 3/11 for sore throat; had negative strep throat cx and negative Covid.  States feels he is getting worse.  C/O very painful sore throat, chills, body aches.  Has not had any meds today.

## 2019-10-01 NOTE — Discharge Instructions (Addendum)
You may take 500mg -650mg  Tylenol every 6 hours for fevers, aches and pains.  I am concerned that you have a severe infection and understand your concerns about avoiding the emergency room.  However, if you continue to worsen including throat pain, inability to swallow, persistent fevers then you need to go to the emergency room.  I am giving you an injection of an antibiotic and medications to help for a severe throat infection but it is possible that you have something more such as an abscess of the posterior part of your throat or a systemic infection such as sepsis.  These are medical emergencies and need to be managed in the emergency room.  Please report there if our treatment plan is not working.

## 2020-03-13 ENCOUNTER — Encounter (HOSPITAL_COMMUNITY): Payer: Self-pay | Admitting: Emergency Medicine

## 2020-03-13 ENCOUNTER — Ambulatory Visit (HOSPITAL_COMMUNITY)
Admission: EM | Admit: 2020-03-13 | Discharge: 2020-03-13 | Disposition: A | Discharge status: Home / Self Care | Payer: Medicaid Other | Attending: Internal Medicine | Admitting: Internal Medicine

## 2020-03-13 DIAGNOSIS — R360 Urethral discharge without blood: Secondary | ICD-10-CM | POA: Insufficient documentation

## 2020-03-13 MED ORDER — DOXYCYCLINE HYCLATE 100 MG PO CAPS: 100.0000 mg | ORAL_CAPSULE | Freq: Two times a day (BID) | ORAL | 0 refills | Status: AC

## 2020-03-13 MED ORDER — CEFTRIAXONE SODIUM 500 MG IJ SOLR
500.0000 mg | Freq: Once | INTRAMUSCULAR | Status: AC
  Administered 2020-03-13: 500 mg via INTRAMUSCULAR

## 2020-03-13 MED ORDER — LIDOCAINE HCL (PF) 1 % IJ SOLN
INTRAMUSCULAR | Status: AC
  Filled 2020-03-13: qty 2

## 2020-03-13 MED ORDER — CEFTRIAXONE SODIUM 500 MG IJ SOLR
INTRAMUSCULAR | Status: AC
  Filled 2020-03-13: qty 500

## 2020-03-13 NOTE — ED Provider Notes (Signed)
[facility]    CSN: [id number] Arrival date & time: 03/13/20  1806      History   Chief Complaint Chief Complaint  Patient presents with  . Penile Discharge    HPI Zachary Mccarty is a 24 y.o. male comes to the urgent care with penile discharge yesterday.  Patient was engaged in unprotected sexual intercourse on Saturday.  He denies any dysuria, urgency or frequency.  No testicular or groin pain.  No ulcerations on the penis.  Patient is sexually active with 2 different females.   HPI  History reviewed. No pertinent past medical history.  Patient Active Problem List   Diagnosis Date Noted  . Rash and nonspecific skin eruption 07/07/2016  . 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 Tetrahydrocannabinol (THC) use disorder, mild, abuse 09/29/2014  . Gynecomastia 08/10/2012  . Allergic conjunctivitis of both eyes 07/30/2011    History reviewed. No pertinent surgical history.     Home Medications    Prior to Admission medications   Medication Sig Start Date End Date Taking? Authorizing Provider  doxycycline (VIBRAMYCIN) 100 MG capsule Take 1 capsule (100 mg total) by mouth 2 (two) times daily for 7 days. 03/13/20 03/20/20  Merrilee Jansky, MD    Family History Family History  Problem Relation Age of Onset  . Healthy Mother   . Healthy Father     Social History Social History   Tobacco Use  . Smoking status: Current Every Day Smoker    Packs/day: 1.00    Types: Cigarettes  . Smokeless tobacco: Never Used  . Tobacco comment: 2 cigs a day  Vaping Use  . Vaping Use: Never used  Substance Use Topics  . Alcohol use: Yes    Comment: occasionally  . Drug use: Not Currently     Allergies   Patient has no known allergies.   Review of Systems Review of Systems  Gastrointestinal: Negative.   Genitourinary: Positive for discharge. Negative for dysuria, flank pain, penile pain, penile swelling, scrotal swelling, testicular  pain and urgency.  Musculoskeletal: Negative.   Psychiatric/Behavioral: Negative.      Physical Exam Triage Vital Signs ED Triage Vitals  Enc Vitals Group     BP 03/13/20 1850 119/82     Pulse Rate 03/13/20 1850 60     Resp 03/13/20 1850 16     Temp 03/13/20 1850 99.2 F (37.3 C)     Temp Source 03/13/20 1850 Oral     SpO2 03/13/20 1850 100 %     Weight --      Height --      Head Circumference --      Peak Flow --      Pain Score 03/13/20 1847 5     Pain Loc --      Pain Edu? --      Excl. in GC? --    No data found.  Updated Vital Signs BP 119/82 (BP Location: Left Arm)   Pulse 60   Temp 99.2 F (37.3 C) (Oral)   Resp 16   SpO2 100%   Visual Acuity Right Eye Distance:   Left Eye Distance:   Bilateral Distance:    Right Eye Near:   Left Eye Near:    Bilateral Near:     Physical Exam Vitals and nursing note reviewed.  Constitutional:      Appearance: Normal appearance.  Cardiovascular:     Rate and Rhythm: Normal rate and regular rhythm.  Genitourinary:    Penis: Normal.      Testes: Normal.     Comments: Yellowish discharge from the penis Musculoskeletal:        General: Normal range of motion.  Neurological:     Mental Status: He is alert.      UC Treatments / Results  Labs (all labs ordered are listed, but only abnormal results are displayed) Labs Reviewed  CYTOLOGY, (ORAL, ANAL, URETHRAL) ANCILLARY ONLY    EKG   Radiology No results found.  Procedures Procedures (including critical care time)  Medications Ordered in UC Medications  cefTRIAXone (ROCEPHIN) injection 500 mg (has no administration in time range)    Initial Impression / Assessment and Plan / UC Course  I have reviewed the triage vital signs and the nursing notes.  Pertinent labs & imaging results that were available during my care of the patient were reviewed by me and considered in my medical decision making (see chart for details).     1.  Penile discharge,  presumed gonorrhea: Ceftriaxone 500 mg IM x1 dose Cytology for GC/chlamydia/trichomonas Safe sex practices advised Patient declined HIV/RPR testing Doxycycline 100 mg twice daily for 7 days for presumed chlamydia infection Patient is advised to inform his sexual partners about his symptoms. Final Clinical Impressions(s) / UC Diagnoses   Final diagnoses:  Penile discharge, without blood   Discharge Instructions   None    ED Prescriptions    Medication Sig Dispense Auth. Provider   doxycycline (VIBRAMYCIN) 100 MG capsule Take 1 capsule (100 mg total) by mouth 2 (two) times daily for 7 days. 14 capsule Arryn Terrones, Britta Mccreedy, MD     PDMP not reviewed this encounter.   Merrilee Jansky, MD 03/13/20 [phone]

## 2020-03-13 NOTE — ED Triage Notes (Addendum)
Pt c/o penile discharge onset yesterday after unprotected sex on Saturday. He declines HIV testing.

## 2020-03-14 LAB — CYTOLOGY, (ORAL, ANAL, URETHRAL) ANCILLARY ONLY
Chlamydia: POSITIVE — AB
Comment: NEGATIVE
Comment: NEGATIVE
Comment: NORMAL
Neisseria Gonorrhea: NEGATIVE
Trichomonas: NEGATIVE

## 2020-07-30 ENCOUNTER — Other Ambulatory Visit: Payer: Self-pay | Admitting: Family Medicine

## 2020-07-30 ENCOUNTER — Ambulatory Visit (HOSPITAL_COMMUNITY)
Admission: RE | Admit: 2020-07-30 | Discharge: 2020-07-30 | Disposition: A | Discharge status: Home / Self Care | Payer: PRIVATE HEALTH INSURANCE | Source: Ambulatory Visit | Attending: Family Medicine | Admitting: Family Medicine

## 2020-07-30 ENCOUNTER — Other Ambulatory Visit: Payer: Self-pay

## 2020-07-30 VITALS — BP 135/83 | HR 90 | Resp 19

## 2020-07-30 DIAGNOSIS — Z113 Encounter for screening for infections with a predominantly sexual mode of transmission: Secondary | ICD-10-CM | POA: Diagnosis not present

## 2020-07-30 NOTE — ED Provider Notes (Signed)
[facility]    CSN: [id number] Arrival date & time: 07/30/20  1601      History   Chief Complaint Chief Complaint  Patient presents with  . STD testing    HPI Zachary Mccarty is a 25 y.o. male.   Here today requesting STI testing as he has a new sexual partner at this time. States he was just tested a few months ago with negative results prior to new partner. Denies sxs including penile discharge, dysuria, fever, chills, pelvic or abdominal pain, rashes.      No past medical history on file.  Patient Active Problem List   Diagnosis Date Noted  . Rash and nonspecific skin eruption 07/07/2016  . 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 Tetrahydrocannabinol (THC) use disorder, mild, abuse 09/29/2014  . Gynecomastia 08/10/2012  . Allergic conjunctivitis of both eyes 07/30/2011    No past surgical history on file.     Home Medications    Prior to Admission medications   Not on File    Family History Family History  Problem Relation Age of Onset  . Healthy Mother   . Healthy Father     Social History Social History   Tobacco Use  . Smoking status: Current Every Day Smoker    Packs/day: 1.00    Types: Cigarettes  . Smokeless tobacco: Never Used  . Tobacco comment: 2 cigs a day  Vaping Use  . Vaping Use: Never used  Substance Use Topics  . Alcohol use: Yes    Comment: occasionally  . Drug use: Not Currently     Allergies   Patient has no known allergies.   Review of Systems Review of Systems PER HPI    Physical Exam Triage Vital Signs ED Triage Vitals [07/30/20 1615]  Enc Vitals Group     BP 135/83     Pulse Rate 90     Resp 19     Temp      Temp src      SpO2 100 %     Weight      Height      Head Circumference      Peak Flow      Pain Score 0     Pain Loc      Pain Edu?      Excl. in GC?    No data found.  Updated Vital Signs BP 135/83   Pulse 90   Resp 19   SpO2 100%   Visual  Acuity Right Eye Distance:   Left Eye Distance:   Bilateral Distance:    Right Eye Near:   Left Eye Near:    Bilateral Near:     Physical Exam Vitals and nursing note reviewed.  Constitutional:      Appearance: Normal appearance.  HENT:     Head: Atraumatic.  Eyes:     Extraocular Movements: Extraocular movements intact.     Conjunctiva/sclera: Conjunctivae normal.  Cardiovascular:     Rate and Rhythm: Normal rate and regular rhythm.  Pulmonary:     Effort: Pulmonary effort is normal.     Breath sounds: Normal breath sounds.  Musculoskeletal:        General: Normal range of motion.     Cervical back: Normal range of motion and neck supple.  Skin:    General: Skin is warm and dry.  Neurological:     General: No focal deficit present.     Mental Status:  He is oriented to person, place, and time.  Psychiatric:        Mood and Affect: Mood normal.        Thought Content: Thought content normal.        Judgment: Judgment normal.      UC Treatments / Results  Labs (all labs ordered are listed, but only abnormal results are displayed) Labs Reviewed  CYTOLOGY, (ORAL, ANAL, URETHRAL) ANCILLARY ONLY    EKG   Radiology No results found.  Procedures Procedures (including critical care time)  Medications Ordered in UC Medications - No data to display  Initial Impression / Assessment and Plan / UC Course  I have reviewed the triage vital signs and the nursing notes.  Pertinent labs & imaging results that were available during my care of the patient were reviewed by me and considered in my medical decision making (see chart for details).     CYtology swab pending, he declines HIV and RPR today. Asymptomatic. Discussed safe sexual practices. Abstain until results return.   Final Clinical Impressions(s) / UC Diagnoses   Final diagnoses:  Screening for STD (sexually transmitted disease)   Discharge Instructions   None    ED Prescriptions    None     PDMP  not reviewed this encounter.   Particia Nearing, [place] 07/30/20 1656

## 2020-07-30 NOTE — ED Triage Notes (Signed)
Pt in requesting STD testing because he has new sexual partner  Denies any sxs

## 2020-07-31 LAB — MOLECULAR ANCILLARY ONLY
Chlamydia: NEGATIVE
Comment: NEGATIVE
Comment: NEGATIVE
Comment: NORMAL
Neisseria Gonorrhea: NEGATIVE
Trichomonas: NEGATIVE

## 2020-10-11 ENCOUNTER — Ambulatory Visit (HOSPITAL_COMMUNITY): Payer: Self-pay

## 2020-11-14 ENCOUNTER — Other Ambulatory Visit: Payer: Self-pay

## 2020-11-14 ENCOUNTER — Ambulatory Visit
Admission: RE | Admit: 2020-11-14 | Discharge: 2020-11-14 | Disposition: A | Discharge status: Home / Self Care | Payer: PRIVATE HEALTH INSURANCE | Source: Ambulatory Visit | Attending: Family Medicine | Admitting: Family Medicine

## 2020-11-14 ENCOUNTER — Ambulatory Visit: Payer: Self-pay

## 2020-11-14 VITALS — BP 130/80 | HR 68 | Temp 98.5°F | Resp 20

## 2020-11-14 DIAGNOSIS — R350 Frequency of micturition: Secondary | ICD-10-CM | POA: Diagnosis not present

## 2020-11-14 LAB — POCT URINALYSIS DIP (MANUAL ENTRY)
Bilirubin, UA: NEGATIVE
Blood, UA: NEGATIVE
Glucose, UA: NEGATIVE mg/dL
Ketones, POC UA: NEGATIVE mg/dL
Leukocytes, UA: NEGATIVE
Nitrite, UA: NEGATIVE
Protein Ur, POC: NEGATIVE mg/dL
Spec Grav, UA: 1.01 (ref 1.010–1.025)
Urobilinogen, UA: 0.2 E.U./dL
pH, UA: 6.5 (ref 5.0–8.0)

## 2020-11-14 NOTE — ED Provider Notes (Signed)
[facility]    CSN: [id number] Arrival date & time: 11/14/20  1154      History   Chief Complaint Chief Complaint  Patient presents with  . Appointment    1200  . SEXUALLY TRANSMITTED DISEASE    HPI Zachary Mccarty is a 25 y.o. male.   Patient presenting today with several day history of urinary frequency.  Denies burning, rashes, lesions, penile discharge, significant abdominal pain, flank pain, known exposures to STDs, history of urinary tract infections.  Has not taken anything over-the-counter for symptoms thus far.  States he had something very similar in the past but it went away on its own.     History reviewed. No pertinent past medical history.  Patient Active Problem List   Diagnosis Date Noted  . Rash and nonspecific skin eruption 07/07/2016  . 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 Tetrahydrocannabinol (THC) use disorder, mild, abuse 09/29/2014  . Gynecomastia 08/10/2012  . Allergic conjunctivitis of both eyes 07/30/2011    History reviewed. No pertinent surgical history.     Home Medications    Prior to Admission medications   Not on File    Family History Family History  Problem Relation Age of Onset  . Healthy Mother   . Healthy Father     Social History Social History   Tobacco Use  . Smoking status: Current Every Day Smoker    Packs/day: 1.00    Types: Cigarettes  . Smokeless tobacco: Never Used  . Tobacco comment: 2 cigs a day  Vaping Use  . Vaping Use: Never used  Substance Use Topics  . Alcohol use: Yes    Comment: occasionally  . Drug use: Not Currently     Allergies   Patient has no known allergies.   Review of Systems Review of Systems Per HPI  Physical Exam Triage Vital Signs ED Triage Vitals  Enc Vitals Group     BP 11/14/20 1221 130/80     Pulse Rate 11/14/20 1221 68     Resp 11/14/20 1221 20     Temp 11/14/20 1221 98.5 F (36.9 C)     Temp Source 11/14/20 1221  Oral     SpO2 11/14/20 1221 97 %     Weight --      Height --      Head Circumference --      Peak Flow --      Pain Score 11/14/20 1225 0     Pain Loc --      Pain Edu? --      Excl. in GC? --    No data found.  Updated Vital Signs BP 130/80 (BP Location: Left Arm)   Pulse 68   Temp 98.5 F (36.9 C) (Oral)   Resp 20   SpO2 97%   Visual Acuity Right Eye Distance:   Left Eye Distance:   Bilateral Distance:    Right Eye Near:   Left Eye Near:    Bilateral Near:     Physical Exam Vitals and nursing note reviewed.  Constitutional:      Appearance: Normal appearance.  HENT:     Head: Atraumatic.     Mouth/Throat:     Mouth: Mucous membranes are moist.     Pharynx: Oropharynx is clear.  Eyes:     Extraocular Movements: Extraocular movements intact.     Conjunctiva/sclera: Conjunctivae normal.  Cardiovascular:     Rate and Rhythm: Normal rate and regular  rhythm.  Pulmonary:     Effort: Pulmonary effort is normal.     Breath sounds: Normal breath sounds.  Abdominal:     General: Bowel sounds are normal. There is no distension.     Palpations: Abdomen is soft.     Tenderness: There is no abdominal tenderness. There is no right CVA tenderness, left CVA tenderness or guarding.  Musculoskeletal:        General: Normal range of motion.     Cervical back: Normal range of motion and neck supple.  Skin:    General: Skin is warm and dry.  Neurological:     General: No focal deficit present.     Mental Status: He is oriented to person, place, and time.  Psychiatric:        Mood and Affect: Mood normal.        Thought Content: Thought content normal.        Judgment: Judgment normal.      UC Treatments / Results  Labs (all labs ordered are listed, but only abnormal results are displayed) Labs Reviewed  POCT URINALYSIS DIP (MANUAL ENTRY)  CYTOLOGY, (ORAL, ANAL, URETHRAL) ANCILLARY ONLY    EKG   Radiology No results found.  Procedures Procedures (including  critical care time)  Medications Ordered in UC Medications - No data to display  Initial Impression / Assessment and Plan / UC Course  I have reviewed the triage vital signs and the nursing notes.  Pertinent labs & imaging results that were available during my care of the patient were reviewed by me and considered in my medical decision making (see chart for details).     Vitals and exam reassuring, UA benign.  Cytology swab pending.  Discussed abstinence from sexual contact until results return.  We will treat based on these results.  Discussed avoiding bladder irritants, drinking plenty of water in the meantime.  Follow-up with primary care if worsening or not resolving.  Final Clinical Impressions(s) / UC Diagnoses   Final diagnoses:  Urinary frequency   Discharge Instructions   None    ED Prescriptions    None     PDMP not reviewed this encounter.   Particia Nearing, [place] 11/14/20 1304

## 2020-11-14 NOTE — ED Triage Notes (Signed)
Pt requesting STD testing and also sts urinary frequency x 4 days

## 2020-11-15 LAB — CYTOLOGY, (ORAL, ANAL, URETHRAL) ANCILLARY ONLY
Chlamydia: NEGATIVE
Comment: NEGATIVE
Comment: NEGATIVE
Comment: NORMAL
Neisseria Gonorrhea: NEGATIVE
Trichomonas: NEGATIVE

## 2021-03-20 ENCOUNTER — Ambulatory Visit (HOSPITAL_COMMUNITY): Payer: Self-pay

## 2021-03-20 ENCOUNTER — Other Ambulatory Visit: Payer: Self-pay

## 2021-03-20 ENCOUNTER — Ambulatory Visit
Admission: RE | Admit: 2021-03-20 | Discharge: 2021-03-20 | Disposition: A | Discharge status: Home / Self Care | Payer: PRIVATE HEALTH INSURANCE | Source: Ambulatory Visit | Attending: Emergency Medicine | Admitting: Emergency Medicine

## 2021-03-20 VITALS — BP 132/85 | HR 67 | Temp 98.2°F | Resp 18

## 2021-03-20 DIAGNOSIS — R102 Pelvic and perineal pain: Secondary | ICD-10-CM | POA: Insufficient documentation

## 2021-03-20 LAB — POCT URINALYSIS DIP (MANUAL ENTRY)
Bilirubin, UA: NEGATIVE
Blood, UA: NEGATIVE
Glucose, UA: NEGATIVE mg/dL
Ketones, POC UA: NEGATIVE mg/dL
Leukocytes, UA: NEGATIVE
Nitrite, UA: NEGATIVE
Protein Ur, POC: NEGATIVE mg/dL
Spec Grav, UA: >=1.03 — AB (ref 1.010–1.025)
Urobilinogen, UA: 0.2 E.U./dL
pH, UA: 6 (ref 5.0–8.0)

## 2021-03-20 MED ORDER — IBUPROFEN 800 MG PO TABS: 800.0000 mg | ORAL_TABLET | Freq: Three times a day (TID) | ORAL | 0 refills | Status: DC

## 2021-03-20 NOTE — Discharge Instructions (Addendum)
Urine normal STD swab pending We will hold off on further blood work Use anti-inflammatories for pain/swelling. You may take up to 800 mg Ibuprofen every 8 hours with food. You may supplement Ibuprofen with Tylenol [phone] mg every 8 hours.  Apply heating pad Please follow-up with primary care-contact below-if symptoms continue to be recurrent

## 2021-03-20 NOTE — ED Triage Notes (Signed)
Patient c/o lower abd (pelvic region) and lower back pain that started two days ago. Denies any urinary changes.

## 2021-03-20 NOTE — ED Provider Notes (Signed)
[facility]    CSN: [id number] Arrival date & time: 03/20/21  0804      History   Chief Complaint Chief Complaint  Patient presents with   Abdominal Pain   Back Pain    HPI Zachary Mccarty is a 25 y.o. male presenting today for evaluation of pelvic pain.  Reports over the past 2 to 3 days he has had discomfort in his lower abdomen/pelvis.  He does report some urinary urgency and incomplete voiding.  Denies any dysuria, frequency or penile discharge.  Denies hematuria or history of stones.  Denies testicle pain or swelling.  Denies any rashes or lesions to groin.  Denies any swollen lymph nodes.  Reports oral intake at baseline, denies nausea or vomiting.  Denies any changes in bowel movements.  Last bowel movement yesterday and reports daily bowel movements.  He reports history of similar in the past which has resolved spontaneously.  Reports history of "UTIs", on chart review has previously tested positive for STDs, but no true UTI noted in his chart.  Denies any new partners.  HPI  History reviewed. No pertinent past medical history.  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 No pertinent surgical history.     Home Medications    Prior to Admission medications   Medication Sig Start Date End Date Taking? Authorizing Provider  ibuprofen (ADVIL) 800 MG tablet Take 1 tablet (800 mg total) by mouth 3 (three) times daily. 03/20/21  Yes Delmos Velaquez, Junius Creamer, PA-C    Family History Family History  Problem Relation Age of Onset   Healthy Mother    Healthy Father     Social History Social History   Tobacco Use   Smoking status: Every Day    Packs/day: 1.00    Types: Cigarettes    Smokeless tobacco: Never   Tobacco comments:    2 cigs a day  Vaping Use   Vaping Use: Never used  Substance Use Topics   Alcohol use: Yes    Comment: occasionally   Drug use: Not Currently     Allergies   Patient has no known allergies.   Review of Systems Review of Systems  Constitutional:  Negative for fever.  HENT:  Negative for sore throat.   Respiratory:  Negative for shortness of breath.   Cardiovascular:  Negative for chest pain.  Gastrointestinal:  Positive for abdominal pain. Negative for nausea and vomiting.  Genitourinary:  Negative for difficulty urinating, dysuria, frequency, penile discharge, penile pain, penile swelling, scrotal swelling and testicular pain.  Skin:  Negative for rash.  Neurological:  Negative for dizziness, light-headedness and headaches.    Physical Exam Triage Vital Signs ED Triage Vitals  Enc Vitals Group     BP 03/20/21 0817 132/85     Pulse Rate 03/20/21 0817 67     Resp 03/20/21 0817 18     Temp 03/20/21 0817 98.2 F (36.8 C)     Temp Source 03/20/21 0817 Oral     SpO2 03/20/21 0817 97 %     Weight --      Height --      Head Circumference --      Peak Flow --  Pain Score 03/20/21 0818 6     Pain Loc --      Pain Edu? --      Excl. in GC? --    No data found.  Updated Vital Signs BP 132/85 (BP Location: Left Arm)   Pulse 67   Temp 98.2 F (36.8 C) (Oral)   Resp 18   SpO2 97%   Visual Acuity Right Eye Distance:   Left Eye Distance:   Bilateral Distance:    Right Eye Near:   Left Eye Near:    Bilateral Near:     Physical Exam Vitals and nursing note reviewed.  Constitutional:      Appearance: He is well-developed.     Comments: No acute distress  HENT:     Head: Normocephalic and atraumatic.     Nose: Nose normal.  Eyes:     Conjunctiva/sclera: Conjunctivae normal.  Cardiovascular:     Rate and Rhythm: Normal rate and regular rhythm.  Pulmonary:     Effort: Pulmonary effort is normal. No  respiratory distress.     Comments: Breathing comfortably at rest, CTABL, no wheezing, rales or other adventitious sounds auscultated  Abdominal:     General: There is no distension.     Comments: Soft, nondistended, nontender to right and left upper quadrants, mild tenderness to palpation to mid lower abdomen extending into suprapubic area, negative rebound, negative Rovsing, negative mcBurney's  Genitourinary:    Penis: Normal.      Testes: Normal.     Comments: No lymphadenopathy No rashes or lesions No discharge noted No testicular swelling Musculoskeletal:        General: Normal range of motion.     Cervical back: Neck supple.  Skin:    General: Skin is warm and dry.  Neurological:     Mental Status: He is alert and oriented to person, place, and time.     UC Treatments / Results  Labs (all labs ordered are listed, but only abnormal results are displayed) Labs Reviewed  POCT URINALYSIS DIP (MANUAL ENTRY) - Abnormal; Notable for the following components:      Result Value   Spec Grav, UA >=1.030 (*)    All other components within normal limits  CYTOLOGY, (ORAL, ANAL, URETHRAL) ANCILLARY ONLY    EKG   Radiology No results found.  Procedures Procedures (including critical care time)  Medications Ordered in UC Medications - No data to display  Initial Impression / Assessment and Plan / UC Course  I have reviewed the triage vital signs and the nursing notes.  Pertinent labs & imaging results that were available during my care of the patient were reviewed by me and considered in my medical decision making (see chart for details).     UA unremarkable, cytology pending. Declined blood work for HIV/RPR.  At this time recommend symptomatic care with heat, NSAIDs and close monitoring.  We will provide further treatment based off cytology results if needed.  Encouraged follow-up with PCP if continued to be recurrent.  Discussed strict return precautions. Patient  verbalized understanding and is agreeable with plan.  Final Clinical Impressions(s) / UC Diagnoses   Final diagnoses:  Pelvic pain in male     Discharge Instructions      Urine normal STD swab pending We will hold off on further blood work Use anti-inflammatories for pain/swelling. You may take up to 800 mg Ibuprofen every 8 hours with food. You may supplement Ibuprofen with Tylenol [phone] mg every 8 hours.  Apply heating pad Please follow-up with primary care-contact below-if symptoms continue to be recurrent     ED Prescriptions     Medication Sig Dispense Auth. Provider   ibuprofen (ADVIL) 800 MG tablet Take 1 tablet (800 mg total) by mouth 3 (three) times daily. 21 tablet Damarko Stitely, Rienzi C, PA-C      PDMP not reviewed this encounter.   Lew Dawes, [place] 03/20/21 [phone]

## 2021-03-21 LAB — CYTOLOGY, (ORAL, ANAL, URETHRAL) ANCILLARY ONLY
Chlamydia: NEGATIVE
Comment: NEGATIVE
Comment: NEGATIVE
Comment: NORMAL
Neisseria Gonorrhea: NEGATIVE
Trichomonas: NEGATIVE

## 2021-05-28 ENCOUNTER — Other Ambulatory Visit: Payer: Self-pay

## 2021-05-28 ENCOUNTER — Ambulatory Visit (HOSPITAL_COMMUNITY)
Admission: EM | Admit: 2021-05-28 | Discharge: 2021-05-28 | Disposition: A | Discharge status: Home / Self Care | Payer: Self-pay | Attending: Urgent Care | Admitting: Urgent Care

## 2021-05-28 ENCOUNTER — Encounter (HOSPITAL_COMMUNITY): Payer: Self-pay

## 2021-05-28 DIAGNOSIS — R1030 Lower abdominal pain, unspecified: Secondary | ICD-10-CM | POA: Insufficient documentation

## 2021-05-28 DIAGNOSIS — R102 Pelvic and perineal pain: Secondary | ICD-10-CM | POA: Insufficient documentation

## 2021-05-28 LAB — POCT URINALYSIS DIPSTICK, ED / UC
Bilirubin Urine: NEGATIVE
Glucose, UA: NEGATIVE mg/dL
Hgb urine dipstick: NEGATIVE
Ketones, ur: 15 mg/dL — AB
Nitrite: NEGATIVE
Protein, ur: NEGATIVE mg/dL
Specific Gravity, Urine: 1.02 (ref 1.005–1.030)
Urobilinogen, UA: 0.2 mg/dL (ref 0.0–1.0)
pH: 6 (ref 5.0–8.0)

## 2021-05-28 MED ORDER — TIZANIDINE HCL 4 MG PO TABS: 4.0000 mg | ORAL_TABLET | Freq: Every day | ORAL | 0 refills | Status: DC

## 2021-05-28 MED ORDER — NAPROXEN 500 MG PO TABS: 500.0000 mg | ORAL_TABLET | Freq: Two times a day (BID) | ORAL | 0 refills | Status: DC

## 2021-05-28 NOTE — ED Provider Notes (Signed)
[facility]   MRN: [id number] DOB: November 08, 1995  Subjective:   Zachary Mccarty is a 25 y.o. male presenting for 3-day history of acute onset recurrent lower abdominal pain, pelvic pain.  Patient was seen sometime ago at a different urgent care for the same symptoms and got tested for STIs, and was negative.  No fever, nausea, vomiting, dysuria, penile pain, penile discharge.  Patient has unprotected sex with 1 male partner.  Would like to be checked again just to make sure that is not the source of his symptoms.  He does do a lot of strenuous work, pushes really heavy tanks.  Does not hydrate well.  No current facility-administered medications for this encounter.  Current Outpatient Medications:    ibuprofen (ADVIL) 800 MG tablet, Take 1 tablet (800 mg total) by mouth 3 (three) times daily., Disp: 21 tablet, Rfl: 0   No Known Allergies  History reviewed. No pertinent past medical history.   History reviewed. No pertinent surgical history.  Family History  Problem Relation Age of Onset   Healthy Mother    Healthy Father     Social History   Tobacco Use   Smoking status: Every Day    Packs/day: 1.00    Types: Cigarettes   Smokeless tobacco: Never   Tobacco comments:    2 cigs a day  Vaping Use   Vaping Use: Never used  Substance Use Topics   Alcohol use: Yes    Comment: occasionally   Drug use: Not Currently    ROS   Objective:   Vitals: BP (!) 145/82 (BP Location: Right Arm)   Pulse 75   Temp 98.1 F (36.7 C) (Oral)   Resp 18   SpO2 100%   Physical Exam Constitutional:      General: He is not in acute distress.    Appearance: Normal appearance. He is well-developed and normal weight. He is not ill-appearing, toxic-appearing or diaphoretic.  HENT:     Head: Normocephalic and atraumatic.     Right Ear: External ear normal.     Left Ear: External ear normal.     Nose: Nose normal.     Mouth/Throat:     Pharynx: Oropharynx is clear.   Eyes:     General: No scleral icterus.       Right eye: No discharge.        Left eye: No discharge.     Extraocular Movements: Extraocular movements intact.     Pupils: Pupils are equal, round, and reactive to light.  Cardiovascular:     Rate and Rhythm: Normal rate.  Pulmonary:     Effort: Pulmonary effort is normal.  Abdominal:     General: Bowel sounds are normal. There is no distension.     Palpations: Abdomen is soft.     Tenderness: There is generalized abdominal tenderness and tenderness in the periumbilical area and suprapubic area. There is no right CVA tenderness, left CVA tenderness, guarding or rebound.  Musculoskeletal:     Cervical back: Normal range of motion.  Neurological:     Mental Status: He is alert and oriented to person, place, and time.  Psychiatric:        Mood and Affect: Mood normal.        Behavior: Behavior normal.        Thought Content: Thought content normal.        Judgment: Judgment normal.    Results for orders placed or performed during  the hospital encounter of 05/28/21 (from the past 24 hour(s))  POC Urinalysis dipstick     Status: Abnormal   Collection Time: 05/28/21  4:30 PM  Result Value Ref Range   Glucose, UA NEGATIVE NEGATIVE mg/dL   Bilirubin Urine NEGATIVE NEGATIVE   Ketones, ur 15 (A) NEGATIVE mg/dL   Specific Gravity, Urine 1.020 1.005 - 1.030   Hgb urine dipstick NEGATIVE NEGATIVE   pH 6.0 5.0 - 8.0   Protein, ur NEGATIVE NEGATIVE mg/dL   Urobilinogen, UA 0.2 0.0 - 1.0 mg/dL   Nitrite NEGATIVE NEGATIVE   Leukocytes,Ua TRACE (A) NEGATIVE    Assessment and Plan :   PDMP not reviewed this encounter.  1. Suprapubic pain   2. Lower abdominal pain    No signs of an acute abdomen.  Suspect musculoskeletal pain, abdominal strain.  Low suspicion for diverticulitis, hernia based off of his exam and HPI.  STI check pending.  Recommended conservative management with naproxen, tizanidine, better hydration. Counseled patient on  potential for adverse effects with medications prescribed/recommended today, ER and return-to-clinic precautions discussed, patient verbalized understanding.    Wallis Bamberg, [place] 05/28/21 1644

## 2021-05-28 NOTE — ED Triage Notes (Signed)
Pt presents with lower abdominal pain X 3 days.  Denies urinary issues.

## 2021-05-29 LAB — CYTOLOGY, (ORAL, ANAL, URETHRAL) ANCILLARY ONLY
Chlamydia: NEGATIVE
Comment: NEGATIVE
Comment: NEGATIVE
Comment: NORMAL
Neisseria Gonorrhea: NEGATIVE
Trichomonas: NEGATIVE

## 2021-06-02 ENCOUNTER — Ambulatory Visit
Admission: EM | Admit: 2021-06-02 | Discharge: 2021-06-02 | Disposition: A | Discharge status: Home / Self Care | Payer: Self-pay | Attending: Physician Assistant | Admitting: Physician Assistant

## 2021-06-02 ENCOUNTER — Other Ambulatory Visit: Payer: Self-pay

## 2021-06-02 DIAGNOSIS — R102 Pelvic and perineal pain: Secondary | ICD-10-CM | POA: Insufficient documentation

## 2021-06-02 LAB — POCT URINALYSIS DIP (MANUAL ENTRY)
Bilirubin, UA: NEGATIVE
Blood, UA: NEGATIVE
Glucose, UA: NEGATIVE mg/dL
Ketones, POC UA: NEGATIVE mg/dL
Leukocytes, UA: NEGATIVE
Nitrite, UA: NEGATIVE
Protein Ur, POC: NEGATIVE mg/dL
Spec Grav, UA: 1.025 (ref 1.010–1.025)
Urobilinogen, UA: 0.2 E.U./dL
pH, UA: 5.5 (ref 5.0–8.0)

## 2021-06-02 NOTE — ED Triage Notes (Signed)
Pt last seen on 11/8 for abdominal pain. Pt denied urinary issues at the last visit, but notes today an 8 day h/o feeling like he can't empty his bladder with ongoing urinary urges.  Today, he c/o worsening lower abdominal pain and urinary issues. Pt did not take prescribed meds due to costs. Has been taking other otc pain meds without relief. No n/v/d. Denies constipation. Denies hematuria.

## 2021-06-02 NOTE — ED Provider Notes (Signed)
[facility]    CSN: [id number] Arrival date & time: 06/02/21  0825      History   Chief Complaint Chief Complaint  Patient presents with   Abdominal Pain   Urinary Frequency    HPI Zachary Mccarty is a 25 y.o. male.   Patient here today for continued suprapubic pain with urinary urgency that started several days ago.  He was recently screened for STDs and had negative screening however states he has had a new sexual partner since that time.  He has not report any other symptoms.  He has not had fever.  He states that abdominal discomfort feels somewhat like bloating at times.  The history is provided by the patient.   History reviewed. No pertinent past medical history.  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 No pertinent surgical history.     Home Medications    Prior to Admission medications   Medication Sig Start Date End Date Taking? Authorizing Provider  ibuprofen (ADVIL) 800 MG tablet Take 1 tablet (800 mg total) by mouth 3 (three) times daily. 03/20/21   Wieters, Hallie C, PA-C  naproxen (NAPROSYN) 500 MG tablet Take 1 tablet (500 mg total) by mouth 2 (two) times daily with a meal. 05/28/21   Jaynee Eagles, PA-C  tiZANidine (ZANAFLEX) 4 MG tablet Take 1 tablet (4 mg total) by mouth at bedtime. 05/28/21   Jaynee Eagles, PA-C    Family History Family History  Problem Relation Age of Onset   Healthy Mother    Healthy Father     Social History Social History   Tobacco Use   Smoking status: Some Days    Packs/day: 1.00    Types: Cigarettes   Smokeless tobacco: Never   Tobacco comments:    2 cigs a day  Vaping Use   Vaping Use: Never used  Substance Use Topics    Alcohol use: Yes    Comment: occasionally   Drug use: Not Currently     Allergies   Patient has no known allergies.   Review of Systems Review of Systems  Constitutional:  Negative for chills and fever.  Eyes:  Negative for discharge and redness.  Respiratory:  Negative for shortness of breath.   Gastrointestinal:  Positive for abdominal pain. Negative for diarrhea, nausea and vomiting.  Genitourinary:  Positive for urgency. Negative for dysuria.    Physical Exam Triage Vital Signs ED Triage Vitals  Enc Vitals Group     BP      Pulse      Resp      Temp      Temp src      SpO2      Weight      Height      Head Circumference      Peak Flow      Pain Score      Pain Loc      Pain Edu?      Excl. in [place]?    No data found.  Updated Vital Signs BP 131/82 (BP Location: Left Arm)   Pulse 76   Temp 98.1 F (36.7 C) (Oral)   Resp 18   SpO2 97%  Physical Exam Vitals and nursing note reviewed.  Constitutional:      General: He is not in acute distress.    Appearance: Normal appearance. He is not ill-appearing.  HENT:     Head: Normocephalic and atraumatic.  Eyes:     Conjunctiva/sclera: Conjunctivae normal.  Cardiovascular:     Rate and Rhythm: Normal rate.  Pulmonary:     Effort: Pulmonary effort is normal. No respiratory distress.  Abdominal:     General: Abdomen is flat.  Skin:    General: Skin is warm and dry.  Neurological:     Mental Status: He is alert.  Psychiatric:        Mood and Affect: Mood normal.        Behavior: Behavior normal.     UC Treatments / Results  Labs (all labs ordered are listed, but only abnormal results are displayed) Labs Reviewed  POCT URINALYSIS DIP (MANUAL ENTRY)  CYTOLOGY, (ORAL, ANAL, URETHRAL) ANCILLARY ONLY    EKG   Radiology No results found.  Procedures Procedures (including critical care time)  Medications Ordered in UC Medications - No data to display  Initial Impression / Assessment and  Plan / UC Course  I have reviewed the triage vital signs and the nursing notes.  Pertinent labs & imaging results that were available during my care of the patient were reviewed by me and considered in my medical decision making (see chart for details).  UA without concerning findings and previous STD screening reviewed and negative.  Will repeat STD screening given new sexual partner, but discussed further evaluation to the emergency department if symptoms fail to improve or worsen as I suspect he will need imaging for evaluation if symptoms continue.  Patient expresses understanding.  Final Clinical Impressions(s) / UC Diagnoses   Final diagnoses:  Suprapubic pain     Discharge Instructions      If symptoms do not improve or worsen in any way please report to:  [facility]  [address] [place], [place] [address]     ED Prescriptions   None    PDMP not reviewed this encounter.   Tomi Bamberger, PA-C 06/02/21 [phone]

## 2021-06-02 NOTE — Discharge Instructions (Signed)
If symptoms do not improve or worsen in any way please report to:  [facility] at [facility]  [address] [place], [place] [address]

## 2021-06-04 ENCOUNTER — Telehealth (HOSPITAL_COMMUNITY): Payer: Self-pay | Admitting: Emergency Medicine

## 2021-06-04 LAB — CYTOLOGY, (ORAL, ANAL, URETHRAL) ANCILLARY ONLY
Chlamydia: NEGATIVE
Comment: NEGATIVE
Comment: NEGATIVE
Comment: NORMAL
Neisseria Gonorrhea: NEGATIVE
Trichomonas: POSITIVE — AB

## 2021-06-04 MED ORDER — METRONIDAZOLE 500 MG PO TABS: 500.0000 mg | ORAL_TABLET | Freq: Two times a day (BID) | ORAL | 0 refills | Status: DC

## 2021-06-04 MED ORDER — METRONIDAZOLE 500 MG PO TABS: 2000.0000 mg | ORAL_TABLET | Freq: Once | ORAL | 0 refills | Status: AC

## 2021-06-04 NOTE — Telephone Encounter (Signed)
Sent weeklong dose of Flagyl and d/c'd, resent one time dose, per protocol.  Called pharmacy and made sure they had the right one

## 2021-06-11 ENCOUNTER — Other Ambulatory Visit: Payer: Self-pay

## 2021-06-11 ENCOUNTER — Ambulatory Visit
Admission: EM | Admit: 2021-06-11 | Discharge: 2021-06-11 | Disposition: A | Discharge status: Home / Self Care | Payer: Medicaid Other | Attending: Emergency Medicine | Admitting: Emergency Medicine

## 2021-06-11 DIAGNOSIS — A599 Trichomoniasis, unspecified: Secondary | ICD-10-CM

## 2021-06-11 MED ORDER — CLINDAMYCIN HCL 150 MG PO CAPS: 150.0000 mg | ORAL_CAPSULE | Freq: Four times a day (QID) | ORAL | 0 refills | Status: DC

## 2021-06-11 MED ORDER — METRONIDAZOLE 500 MG PO TABS
500.0000 mg | ORAL_TABLET | Freq: Two times a day (BID) | ORAL | 0 refills | Status: DC
Start: 2021-06-11 — End: 2021-07-04

## 2021-06-11 NOTE — ED Provider Notes (Signed)
[facility]    CSN: [id number] Arrival date & time: 06/11/21  0805      History   Chief Complaint No chief complaint on file.   HPI Zachary Mccarty is a 25 y.o. male.   Pt is here for same sx after testing positive for trich sti . Still has some frequency and lower abd pain. Denies any n/v/d has not had intercourse since testing. Has not taken anything pta    History reviewed. No pertinent past medical history.  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 No pertinent surgical history.     Home Medications    Prior to Admission medications   Medication Sig Start Date End Date Taking? Authorizing Provider  clindamycin (CLEOCIN) 150 MG capsule Take 1 capsule (150 mg total) by mouth every 6 (six) hours. 06/11/21  Yes Coralyn Mark, NP  metroNIDAZOLE (FLAGYL) 500 MG tablet Take 1 tablet (500 mg total) by mouth 2 (two) times daily. 06/11/21  Yes Coralyn Mark, NP  ibuprofen (ADVIL) 800 MG tablet Take 1 tablet (800 mg total) by mouth 3 (three) times daily. 03/20/21   Wieters, Hallie C, PA-C  naproxen (NAPROSYN) 500 MG tablet Take 1 tablet (500 mg total) by mouth 2 (two) times daily with a meal. 05/28/21   Wallis Bamberg, PA-C  tiZANidine (ZANAFLEX) 4 MG tablet Take 1 tablet (4 mg total) by mouth at bedtime. 05/28/21   Wallis Bamberg, PA-C    Family History Family History  Problem Relation Age of Onset   Healthy Mother    Healthy Father     Social History Social History   Tobacco Use   Smoking status: Some Days    Packs/day: 1.00    Types: Cigarettes   Smokeless tobacco: Never   Tobacco comments:    2 cigs a day  Vaping Use   Vaping Use: Never used  Substance Use  Topics   Alcohol use: Yes    Comment: occasionally   Drug use: Not Currently     Allergies   Patient has no known allergies.   Review of Systems Review of Systems  Constitutional:  Negative for fever.  Respiratory: Negative.    Cardiovascular: Negative.   Gastrointestinal:  Positive for abdominal pain. Negative for diarrhea, nausea and vomiting.  Genitourinary:  Positive for flank pain, frequency and urgency. Negative for penile discharge, penile pain, penile swelling, scrotal swelling and testicular pain.  Skin: Negative.   Neurological: Negative.     Physical Exam Triage Vital Signs ED Triage Vitals  Enc Vitals Group     BP 06/11/21 0822 133/86     Pulse Rate 06/11/21 0822 71     Resp 06/11/21 0822 20     Temp 06/11/21 0822 98.2 F (36.8 C)     Temp Source 06/11/21 0822 Oral     SpO2 06/11/21 0822 98 %     Weight --      Height --      Head Circumference --      Peak Flow --      Pain Score 06/11/21 0821 5     Pain Loc --      Pain Edu? --      Excl. in [place]? --  No data found.  Updated Vital Signs BP 133/86 (BP Location: Right Arm)   Pulse 71   Temp 98.2 F (36.8 C) (Oral)   Resp 20   SpO2 98%   Visual Acuity Right Eye Distance:   Left Eye Distance:   Bilateral Distance:    Right Eye Near:   Left Eye Near:    Bilateral Near:     Physical Exam Constitutional:      Appearance: Normal appearance.  Cardiovascular:     Rate and Rhythm: Normal rate.  Pulmonary:     Effort: Pulmonary effort is normal.  Abdominal:     General: Abdomen is flat. Bowel sounds are normal.     Tenderness: There is no right CVA tenderness or left CVA tenderness.  Musculoskeletal:        General: Normal range of motion.  Skin:    General: Skin is warm.  Neurological:     Mental Status: He is alert.     UC Treatments / Results  Labs (all labs ordered are listed, but only abnormal results are displayed) Labs Reviewed - No data to display  EKG   Radiology No  results found.  Procedures Procedures (including critical care time)  Medications Ordered in UC Medications - No data to display  Initial Impression / Assessment and Plan / UC Course  I have reviewed the triage vital signs and the nursing notes.  Pertinent labs & imaging results that were available during my care of the patient were reviewed by me and considered in my medical decision making (see chart for details).     Since you have not had sexual encounter since last treatment we will need to extend tx no need to retest  Do not drink etoh while taking meds  Take with foods  Avoid intercourse until 7 days  Return if sx still present after 1 week  Final Clinical Impressions(s) / UC Diagnoses   Final diagnoses:  Trichimoniasis     Discharge Instructions      Since you have not had sexual encounter since last treatment we will need to extend tx no need to retest  Do not drink etoh while taking meds  Take with foods       ED Prescriptions     Medication Sig Dispense Auth. Provider   metroNIDAZOLE (FLAGYL) 500 MG tablet Take 1 tablet (500 mg total) by mouth 2 (two) times daily. 14 tablet Morley Kos L, NP   clindamycin (CLEOCIN) 150 MG capsule Take 1 capsule (150 mg total) by mouth every 6 (six) hours. 28 capsule Marney Setting, NP      PDMP not reviewed this encounter.   Marney Setting, NP 06/11/21 [phone]

## 2021-06-11 NOTE — ED Triage Notes (Signed)
Pt states he is still feeling the urge to urinate multiple times and is still having lower abd pain. Patient denies other symptoms at this time.

## 2021-06-11 NOTE — Discharge Instructions (Addendum)
Since you have not had sexual encounter since last treatment we will need to extend tx no need to retest  Do not drink etoh while taking meds  Take with foods

## 2021-07-03 ENCOUNTER — Other Ambulatory Visit: Payer: Self-pay

## 2021-07-03 ENCOUNTER — Encounter (HOSPITAL_COMMUNITY): Payer: Self-pay

## 2021-07-03 ENCOUNTER — Ambulatory Visit (HOSPITAL_COMMUNITY)
Admission: EM | Admit: 2021-07-03 | Discharge: 2021-07-03 | Disposition: A | Discharge status: Home / Self Care | Payer: BC Managed Care – PPO | Attending: Urgent Care | Admitting: Urgent Care

## 2021-07-03 DIAGNOSIS — K529 Noninfective gastroenteritis and colitis, unspecified: Secondary | ICD-10-CM | POA: Diagnosis present

## 2021-07-03 DIAGNOSIS — R197 Diarrhea, unspecified: Secondary | ICD-10-CM | POA: Insufficient documentation

## 2021-07-03 DIAGNOSIS — R110 Nausea: Secondary | ICD-10-CM | POA: Diagnosis present

## 2021-07-03 LAB — RESPIRATORY PANEL BY PCR

## 2021-07-03 MED ORDER — ONDANSETRON 8 MG PO TBDP: 8.0000 mg | ORAL_TABLET | Freq: Three times a day (TID) | ORAL | 0 refills | Status: DC | PRN

## 2021-07-03 MED ORDER — LOPERAMIDE HCL 2 MG PO CAPS: 2.0000 mg | ORAL_CAPSULE | Freq: Two times a day (BID) | ORAL | 0 refills | Status: DC | PRN

## 2021-07-03 NOTE — Discharge Instructions (Signed)

## 2021-07-03 NOTE — ED Triage Notes (Signed)
Pt presents with generalized abdominal pain and diarrhea X 2 days.

## 2021-07-03 NOTE — ED Provider Notes (Signed)
[facility]   MRN: [id number] DOB: October 04, 1995  Subjective:   Zachary Mccarty is a 25 y.o. male presenting for 2-day history of acute onset belly pain, persistent diarrhea having 6-10 bouts a day.  He is also been nauseous but no vomiting.  No fever, headache, body aches, chest pain, shortness of breath or wheezing.  Patient did take antibiotics in mid November including metronidazole and clindamycin to cover for trichomoniasis.  He completed the course by the end of November.  No hospitalizations.  No history of GI issues.  No travel outside the country.  Patient states that he can distinctly remember he started having an upset stomach after he ate at [facility] and then progressed to diarrhea that night.  Would like to be checked for influenza.  No current facility-administered medications for this encounter.  Current Outpatient Medications:    clindamycin (CLEOCIN) 150 MG capsule, Take 1 capsule (150 mg total) by mouth every 6 (six) hours., Disp: 28 capsule, Rfl: 0   ibuprofen (ADVIL) 800 MG tablet, Take 1 tablet (800 mg total) by mouth 3 (three) times daily., Disp: 21 tablet, Rfl: 0   metroNIDAZOLE (FLAGYL) 500 MG tablet, Take 1 tablet (500 mg total) by mouth 2 (two) times daily., Disp: 14 tablet, Rfl: 0   naproxen (NAPROSYN) 500 MG tablet, Take 1 tablet (500 mg total) by mouth 2 (two) times daily with a meal., Disp: 30 tablet, Rfl: 0   tiZANidine (ZANAFLEX) 4 MG tablet, Take 1 tablet (4 mg total) by mouth at bedtime., Disp: 30 tablet, Rfl: 0   No Known Allergies  History reviewed. No pertinent past medical history.   History reviewed. No pertinent surgical history.  Family History  Problem Relation Age of Onset   Healthy Mother    Healthy Father     Social History   Tobacco Use   Smoking status: Some Days    Packs/day: 1.00    Types: Cigarettes   Smokeless tobacco: Never   Tobacco comments:    2 cigs a day  Vaping Use   Vaping Use: Never used   Substance Use Topics   Alcohol use: Yes    Comment: occasionally   Drug use: Not Currently    ROS   Objective:   Vitals: BP 138/89 (BP Location: Left Arm)    Pulse 89    Temp 99.6 F (37.6 C) (Oral)    Resp 18    SpO2 99%   Physical Exam Constitutional:      General: He is not in acute distress.    Appearance: Normal appearance. He is well-developed and normal weight. He is not ill-appearing, toxic-appearing or diaphoretic.  HENT:     Head: Normocephalic and atraumatic.     Right Ear: External ear normal.     Left Ear: External ear normal.     Nose: Nose normal.     Mouth/Throat:     Pharynx: Oropharynx is clear.  Eyes:     General: No scleral icterus.       Right eye: No discharge.        Left eye: No discharge.     Extraocular Movements: Extraocular movements intact.     Pupils: Pupils are equal, round, and reactive to light.  Cardiovascular:     Rate and Rhythm: Normal rate and regular rhythm.     Heart sounds: No murmur heard.   No friction rub. No gallop.  Pulmonary:     Effort: Pulmonary effort is normal.  No respiratory distress.     Breath sounds: No wheezing or rales.  Abdominal:     General: Bowel sounds are increased. There is no distension.     Palpations: Abdomen is soft. There is no mass.     Tenderness: There is no abdominal tenderness. There is no guarding or rebound. Negative signs include Murphy's sign and McBurney's sign.  Musculoskeletal:     Cervical back: Normal range of motion.  Skin:    General: Skin is warm and dry.  Neurological:     Mental Status: He is alert and oriented to person, place, and time.  Psychiatric:        Mood and Affect: Mood normal.        Behavior: Behavior normal.        Thought Content: Thought content normal.        Judgment: Judgment normal.    Assessment and Plan :   PDMP not reviewed this encounter.  1. Colitis   2. Nausea   3. Diarrhea, unspecified type    Will manage for suspected viral colitis with  supportive care.  Despite his recent use of antibiotics, low suspicion for C. difficile given lack of bloody stools, timing of his antibiotic use.  Recommended patient hydrate well, eat light meals and maintain electrolytes.  Will use Zofran and Imodium for nausea, vomiting and diarrhea.  At patient's request, respiratory panel pending.  Counseled patient on potential for adverse effects with medications prescribed/recommended today, ER and return-to-clinic precautions discussed, patient verbalized understanding.     Jaynee Eagles, PA-C 07/03/21 1138

## 2021-07-04 ENCOUNTER — Ambulatory Visit (HOSPITAL_COMMUNITY)
Admission: RE | Admit: 2021-07-04 | Discharge: 2021-07-04 | Disposition: A | Discharge status: Home / Self Care | Payer: BC Managed Care – PPO | Source: Ambulatory Visit | Attending: Internal Medicine | Admitting: Internal Medicine

## 2021-07-04 ENCOUNTER — Encounter (HOSPITAL_COMMUNITY): Payer: Self-pay

## 2021-07-04 ENCOUNTER — Other Ambulatory Visit: Payer: Self-pay

## 2021-07-04 VITALS — BP 120/85 | HR 85 | Temp 98.7°F | Resp 18

## 2021-07-04 DIAGNOSIS — A084 Viral intestinal infection, unspecified: Secondary | ICD-10-CM | POA: Diagnosis not present

## 2021-07-04 MED ORDER — CULTURELLE PROBIOTICS + MULTIV PO CHEW: 1.0000 | CHEWABLE_TABLET | Freq: Three times a day (TID) | ORAL | Status: DC

## 2021-07-04 NOTE — ED Triage Notes (Signed)
Abdominal pain, diarrhea since Monday.  [facility].  Since being here, has had sharp abdominal pain and increase diarrhea "since taking that medicine"

## 2021-07-04 NOTE — Discharge Instructions (Signed)
Please stop taking Imodium Buy over-the-counter probiotics Increase oral fluid intake Take Zofran if needed Symptoms will resolve in the next few days Return to urgent care if you have any worsening symptoms.

## 2021-07-05 NOTE — ED Provider Notes (Signed)
[facility]    CSN: [id number] Arrival date & time: 07/04/21  1800      History   Chief Complaint Chief Complaint  Patient presents with   Appointment    6:00 pm   Abdominal Pain    HPI Zachary Mccarty is a 25 y.o. male comes to the urgent care for follow-up. Patient was seen for viral gastroenteritis.  Patient was prescribed Zofran and Imodium.  Patient complains of worsening abdominal cramps after taking Imodium.  No fever or chills.  No abdominal distention.  No nausea or vomiting.  Patient says the stool volume has decreased and the cramps are worse when he is about to have a bowel movement.  No blood in stools.     HPI  History reviewed. No pertinent past medical history.  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 No pertinent surgical history.     Home Medications    Prior to Admission medications   Medication Sig Start Date End Date Taking? Authorizing Provider  Multiple Vitamins-Minerals (CULTURELLE PROBIOTICS + MULTIV) CHEW Chew 1 tablet by mouth 3 (three) times daily. 07/04/21  Yes Elonzo Sopp, Myrene Galas, MD  loperamide (IMODIUM) 2 MG capsule Take 1 capsule (2 mg total) by mouth 2 (two) times daily as needed for diarrhea or loose stools. 07/03/21   Jaynee Eagles, PA-C  ondansetron (ZOFRAN-ODT) 8 MG disintegrating tablet Take 1 tablet (8 mg total) by mouth every 8 (eight) hours as needed for nausea or vomiting. 07/03/21   Jaynee Eagles, PA-C    Family History Family History  Problem Relation Age of Onset   Healthy Mother    Healthy Father     Social History Social History   Tobacco Use   Smoking status: Some Days    Packs/day: 1.00    Types: Cigarettes   Smokeless  tobacco: Never   Tobacco comments:    2 cigs a day  Vaping Use   Vaping Use: Never used  Substance Use Topics   Alcohol use: Yes    Comment: occasionally   Drug use: Not Currently     Allergies   Patient has no known allergies.   Review of Systems Review of Systems  Constitutional: Negative.   Gastrointestinal:  Positive for abdominal pain and diarrhea. Negative for abdominal distention, nausea and vomiting.  Neurological: Negative.     Physical Exam Triage Vital Signs ED Triage Vitals  Enc Vitals Group     BP 07/04/21 1821 120/85     Pulse Rate 07/04/21 1821 85     Resp 07/04/21 1821 18     Temp 07/04/21 1821 98.7 F (37.1 C)     Temp Source 07/04/21 1821 Oral     SpO2 07/04/21 1821 96 %     Weight --      Height --      Head Circumference --      Peak Flow --      Pain Score 07/04/21 1818 8     Pain Loc --      Pain Edu? --      Excl. in [place]? --    No data found.  Updated Vital Signs BP 120/85    Pulse 85    Temp  98.7 F (37.1 C) (Oral)    Resp 18    SpO2 96%   Visual Acuity Right Eye Distance:   Left Eye Distance:   Bilateral Distance:    Right Eye Near:   Left Eye Near:    Bilateral Near:     Physical Exam Vitals and nursing note reviewed.  Constitutional:      General: He is not in acute distress.    Appearance: He is not ill-appearing.  Abdominal:     General: Abdomen is flat. Bowel sounds are normal. There is no distension or abdominal bruit.     Palpations: Abdomen is soft. There is no shifting dullness, hepatomegaly or splenomegaly.     Tenderness: There is no abdominal tenderness.  Neurological:     Mental Status: He is alert.     UC Treatments / Results  Labs (all labs ordered are listed, but only abnormal results are displayed) Labs Reviewed - No data to display  EKG   Radiology No results found.  Procedures Procedures (including critical care time)  Medications Ordered in UC Medications - No data to display  Initial  Impression / Assessment and Plan / UC Course  I have reviewed the triage vital signs and the nursing notes.  Pertinent labs & imaging results that were available during my care of the patient were reviewed by me and considered in my medical decision making (see chart for details).     1.  Viral gastroenteritis: Patient is advised to stop taking Imodium Increase oral fluid intake Zofran as needed for nausea If symptoms worsen please return to urgent care Probiotics recommended. Final Clinical Impressions(s) / UC Diagnoses   Final diagnoses:  Viral gastroenteritis     Discharge Instructions      Please stop taking Imodium Buy over-the-counter probiotics Increase oral fluid intake Take Zofran if needed Symptoms will resolve in the next few days Return to urgent care if you have any worsening symptoms.   ED Prescriptions     Medication Sig Dispense Auth. Provider   Multiple Vitamins-Minerals (CULTURELLE PROBIOTICS + MULTIV) CHEW Chew 1 tablet by mouth 3 (three) times daily. -- Merrilee Jansky, MD      PDMP not reviewed this encounter.   Merrilee Jansky, MD 07/05/21 1257

## 2021-07-12 ENCOUNTER — Emergency Department (HOSPITAL_COMMUNITY): Payer: BC Managed Care – PPO

## 2021-07-12 ENCOUNTER — Emergency Department (HOSPITAL_COMMUNITY)
Admission: EM | Admit: 2021-07-12 | Discharge: 2021-07-12 | Disposition: A | Discharge status: Home / Self Care | Payer: BC Managed Care – PPO | Attending: Emergency Medicine | Admitting: Emergency Medicine

## 2021-07-12 DIAGNOSIS — R1084 Generalized abdominal pain: Secondary | ICD-10-CM | POA: Diagnosis present

## 2021-07-12 DIAGNOSIS — M25562 Pain in left knee: Secondary | ICD-10-CM | POA: Diagnosis not present

## 2021-07-12 DIAGNOSIS — F17210 Nicotine dependence, cigarettes, uncomplicated: Secondary | ICD-10-CM | POA: Diagnosis not present

## 2021-07-12 DIAGNOSIS — M25521 Pain in right elbow: Secondary | ICD-10-CM | POA: Diagnosis not present

## 2021-07-12 DIAGNOSIS — Y92410 Unspecified street and highway as the place of occurrence of the external cause: Secondary | ICD-10-CM | POA: Insufficient documentation

## 2021-07-12 LAB — CBC
HCT: 48.5 % (ref 39.0–52.0)
Hemoglobin: 15.8 g/dL (ref 13.0–17.0)
MCH: 30.6 pg (ref 26.0–34.0)
MCHC: 32.6 g/dL (ref 30.0–36.0)
MCV: 94 fL (ref 80.0–100.0)
Platelets: 351 10*3/uL (ref 150–400)
RBC: 5.16 MIL/uL (ref 4.22–5.81)
RDW: 11.9 % (ref 11.5–15.5)
WBC: 7.6 10*3/uL (ref 4.0–10.5)
nRBC: 0 % (ref 0.0–0.2)

## 2021-07-12 LAB — COMPREHENSIVE METABOLIC PANEL
ALT: 29 U/L (ref 0–44)
AST: 21 U/L (ref 15–41)
Albumin: 4.2 g/dL (ref 3.5–5.0)
Alkaline Phosphatase: 52 U/L (ref 38–126)
Anion gap: 8 (ref 5–15)
BUN: 9 mg/dL (ref 6–20)
CO2: 24 mmol/L (ref 22–32)
Calcium: 9.6 mg/dL (ref 8.9–10.3)
Chloride: 105 mmol/L (ref 98–111)
Creatinine, Ser: 1.12 mg/dL (ref 0.61–1.24)
GFR, Estimated: >60 mL/min (ref >60)
Glucose, Bld: 98 mg/dL (ref 70–99)
Potassium: 4 mmol/L (ref 3.5–5.1)
Sodium: 137 mmol/L (ref 135–145)
Total Bilirubin: 0.6 mg/dL (ref 0.3–1.2)
Total Protein: 7.3 g/dL (ref 6.5–8.1)

## 2021-07-12 LAB — LIPASE, BLOOD: Lipase: 27 U/L (ref 11–51)

## 2021-07-12 MED ORDER — NAPROXEN 375 MG PO TABS: 375.0000 mg | ORAL_TABLET | Freq: Two times a day (BID) | ORAL | 0 refills | Status: DC

## 2021-07-12 MED ORDER — IOHEXOL 300 MG/ML  SOLN
100.0000 mL | Freq: Once | INTRAMUSCULAR | Status: AC | PRN
  Administered 2021-07-12: 12:00:00 100 mL via INTRAVENOUS

## 2021-07-12 MED ORDER — HYDROCODONE-ACETAMINOPHEN 5-325 MG PO TABS
1.0000 | ORAL_TABLET | Freq: Once | ORAL | Status: AC
  Administered 2021-07-12: 16:00:00 1 via ORAL
  Filled 2021-07-12: qty 1

## 2021-07-12 MED ORDER — CYCLOBENZAPRINE HCL 10 MG PO TABS: 10.0000 mg | ORAL_TABLET | Freq: Two times a day (BID) | ORAL | 0 refills | Status: DC | PRN
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
## 2021-07-12 NOTE — Progress Notes (Signed)
Orthopedic Tech Progress Note Patient Details:  Zachary Mccarty May 10, 1996 [id number]  Ortho Devices Type of Ortho Device: Knee Immobilizer Ortho Device/Splint Location: rle Ortho Device/Splint Interventions: Ordered, Application, Adjustment   Post Interventions Patient Tolerated: Well  Al Decant 07/12/2021, 4:19 PM
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
## 2021-07-12 NOTE — Discharge Instructions (Addendum)
Imaging of your abdomen, left knee, and right elbow were negative.  You will be sore in the coming days.  Please take Flexeril twice per day as needed for muscle spasm and tightness.  Please not mix this medication with alcohol or operate heavy machinery as this can make you drowsy.  Please take naproxen twice daily for pain control and swelling.  Would also like for you to ice the knee periodically throughout the day for the next 3 days and switch to heat.  Please follow-up with orthopedics as needed for knee pain.  Please return to the emergency department if you experience worsening symptoms, trouble breathing, worsening knee pain, trouble walking, weakness or numbness to the lower leg, or any other concerns you might have.
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
## 2021-09-10 ENCOUNTER — Ambulatory Visit: Payer: Self-pay

## 2021-11-23 ENCOUNTER — Ambulatory Visit (INDEPENDENT_AMBULATORY_CARE_PROVIDER_SITE_OTHER): Payer: BC Managed Care – PPO

## 2021-11-23 ENCOUNTER — Ambulatory Visit (HOSPITAL_COMMUNITY)
Admission: EM | Admit: 2021-11-23 | Discharge: 2021-11-23 | Disposition: A | Discharge status: Home / Self Care | Payer: BC Managed Care – PPO | Attending: Physician Assistant | Admitting: Physician Assistant

## 2021-11-23 ENCOUNTER — Encounter (HOSPITAL_COMMUNITY): Payer: Self-pay | Admitting: Emergency Medicine

## 2021-11-23 ENCOUNTER — Other Ambulatory Visit: Payer: Self-pay

## 2021-11-23 DIAGNOSIS — R042 Hemoptysis: Secondary | ICD-10-CM

## 2021-11-23 DIAGNOSIS — R059 Cough, unspecified: Secondary | ICD-10-CM | POA: Diagnosis not present

## 2021-11-23 DIAGNOSIS — R051 Acute cough: Secondary | ICD-10-CM

## 2021-11-23 DIAGNOSIS — J40 Bronchitis, not specified as acute or chronic: Secondary | ICD-10-CM | POA: Diagnosis not present

## 2021-11-23 DIAGNOSIS — J329 Chronic sinusitis, unspecified: Secondary | ICD-10-CM

## 2021-11-23 MED ORDER — AMOXICILLIN-POT CLAVULANATE 875-125 MG PO TABS: 1.0000 | ORAL_TABLET | Freq: Two times a day (BID) | ORAL | 0 refills | Status: DC

## 2021-11-23 NOTE — ED Provider Notes (Signed)
?[facility] ? ? ? ?CSN: [id number] ?Arrival date & time: 11/23/21  1013 ? ? ?  ? ?History   ?Chief Complaint ?Chief Complaint  ?Patient presents with  ? Hemoptysis  ? ? ?HPI ?Zachary Mccarty is a 26 y.o. male.  ? ?Patient presents today with several day history of URI symptoms that worsened overnight.  This morning he woke up and was coughing up blood streaked sputum.  He denies any frank hemoptysis.  He does report sore throat as well as congestion.  Symptoms worsened after he went out last night and was drinking alcohol and smoking cigarettes.  He denies any known history of asthma, allergies, COPD.  He denies any shortness of breath, chest pain, nausea, vomiting, fever.  Denies any known sick contacts.  He has not tried any over-the-counter medication for symptom management.  He denies any history of bleeding disorder.  He does not take blood thinning medications.  He denies any melena, hematochezia, hematemesis, hematuria, significant bruising.  He denies any palpitations, chest pain, shortness of breath. ? ? ?History reviewed. No pertinent past medical history. ? ?Patient Active Problem List  ? Diagnosis Date Noted  ? Rash and nonspecific skin eruption 07/07/2016  ? 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 Tetrahydrocannabinol (THC) use disorder, mild, abuse 09/29/2014  ? Gynecomastia 08/10/2012  ? Allergic conjunctivitis of both eyes 07/30/2011  ? ? ?History reviewed. No pertinent surgical history. ? ? ? ? ?Home Medications   ? ?Prior to Admission medications   ?Medication Sig Start Date End Date Taking? Authorizing Provider  ?amoxicillin-clavulanate (AUGMENTIN) 875-125 MG tablet Take 1 tablet by mouth every 12 (twelve) hours. 11/23/21  Yes Dezra Mandella, Noberto Retort, PA-C  ? ? ?Family History ?Family History  ?Problem Relation Age of Onset  ? Healthy Mother   ? Healthy Father   ? ? ?Social History ?Social History  ? ?Tobacco Use  ? Smoking status: Some Days  ?  Packs/day: 1.00  ?  Types:  Cigarettes  ? Smokeless tobacco: Never  ? Tobacco comments:  ?  2 cigs a day  ?Vaping Use  ? Vaping Use: Never used  ?Substance Use Topics  ? Alcohol use: Yes  ?  Comment: occasionally  ? Drug use: Not Currently  ? ? ? ?Allergies   ?Patient has no known allergies. ? ? ?Review of Systems ?Review of Systems  ?Constitutional:  Positive for activity change. Negative for appetite change, fatigue and fever.  ?HENT:  Positive for congestion and sore throat. Negative for sinus pressure and sneezing.   ?Respiratory:  Positive for cough. Negative for shortness of breath.   ?Cardiovascular:  Negative for chest pain.  ?Gastrointestinal:  Negative for abdominal pain, diarrhea, nausea and vomiting.  ?Neurological:  Negative for dizziness, light-headedness and headaches.  ? ? ?Physical Exam ?Triage Vital Signs ?ED Triage Vitals  ?Enc Vitals Group  ?   BP 11/23/21 1117 120/73  ?   Pulse Rate 11/23/21 1117 93  ?   Resp 11/23/21 1117 20  ?   Temp 11/23/21 1117 98.7 ?F (37.1 ?C)  ?   Temp Source 11/23/21 1117 Oral  ?   SpO2 11/23/21 1117 95 %  ?   Weight --   ?   Height --   ?   Head Circumference --   ?   Peak Flow --   ?   Pain Score 11/23/21 1115 6  ?   Pain Loc --   ?   Pain  Edu? --   ?   Excl. in [place]? --   ? ?No data found. ? ?Updated Vital Signs ?BP 120/73 (BP Location: Right Arm) Comment (BP Location): large cuff  Pulse 93   Temp 98.7 ?F (37.1 ?C) (Oral)   Resp 20   SpO2 95%  ? ?Visual Acuity ?Right Eye Distance:   ?Left Eye Distance:   ?Bilateral Distance:   ? ?Right Eye Near:   ?Left Eye Near:    ?Bilateral Near:    ? ?Physical Exam ?Vitals reviewed.  ?Constitutional:   ?   General: He is awake.  ?   Appearance: Normal appearance. He is well-developed. He is not ill-appearing.  ?   Comments: Very pleasant male appears stated age in no acute distress sitting comfortably in exam room  ?HENT:  ?   Head: Normocephalic and atraumatic.  ?   Right Ear: Tympanic membrane, ear canal and external ear normal. Tympanic membrane is not  erythematous or bulging.  ?   Left Ear: Tympanic membrane, ear canal and external ear normal. Tympanic membrane is not erythematous or bulging.  ?   Nose: Nose normal.  ?   Right Nostril: No epistaxis.  ?   Left Nostril: No epistaxis.  ?   Mouth/Throat:  ?   Pharynx: Uvula midline. Posterior oropharyngeal erythema present. No oropharyngeal exudate or uvula swelling.  ?Cardiovascular:  ?   Rate and Rhythm: Normal rate and regular rhythm.  ?   Heart sounds: Normal heart sounds, S1 normal and S2 normal. No murmur heard. ?Pulmonary:  ?   Effort: Pulmonary effort is normal. No accessory muscle usage or respiratory distress.  ?   Breath sounds: No stridor. Rhonchi present. No wheezing or rales.  ?   Comments: Scattered rhonchi ?Abdominal:  ?   General: Bowel sounds are normal.  ?   Palpations: Abdomen is soft.  ?   Tenderness: There is no abdominal tenderness.  ?Neurological:  ?   Mental Status: He is alert.  ?Psychiatric:     ?   Behavior: Behavior is cooperative.  ? ? ? ?UC Treatments / Results  ?Labs ?(all labs ordered are listed, but only abnormal results are displayed) ?Labs Reviewed - No data to display ? ?EKG ? ? ?Radiology ?DG Chest 2 View ? ?Result Date: 11/23/2021 ?CLINICAL DATA:  Cough EXAM: CHEST - 2 VIEW COMPARISON:  None Available. FINDINGS: The heart size and mediastinal contours are within normal limits. Both lungs are clear. The visualized skeletal structures are unremarkable. IMPRESSION: No active cardiopulmonary disease. Electronically Signed   By: Yetta Glassman M.D.   On: 11/23/2021 11:43   ? ?Procedures ?Procedures (including critical care time) ? ?Medications Ordered in UC ?Medications - No data to display ? ?Initial Impression / Assessment and Plan / UC Course  ?I have reviewed the triage vital signs and the nursing notes. ? ?Pertinent labs & imaging results that were available during my care of the patient were reviewed by me and considered in my medical decision making (see chart for  details). ? ?  ? ?Patient is well-appearing, afebrile, nontoxic, nontachycardic.  Well's score of 0 indicating low risk for PE.  X-ray was obtained that showed no acute cardiopulmonary disease.  Suspect that blood-tinged mucus is from irritation related to smoking and drinking yesterday.  Recommended drinking plenty fluids and using humidifier for symptom relief.  Given worsening cough will also cover for infection with Augmentin.  Recommended over-the-counter medications including Mucinex, Tylenol, Flonase for additional symptom relief.  Discussed that if he has any worsening symptoms including persistent blood-tinged sputum, frank hemoptysis, fever, shortness of breath, chest pain, nausea, vomiting he should be seen immediately to which he expressed understanding.  Work excuse note provided. ? ?Final Clinical Impressions(s) / UC Diagnoses  ? ?Final diagnoses:  ?Sinobronchitis  ?Acute cough  ?Blood-tinged sputum  ? ? ? ?Discharge Instructions   ? ?  ?Your x-ray was normal which is great news.  I believe that the blood that you are seeing in your sputum is related to you irritation from drinking and smoking.  Please make sure you are drinking plenty of fluid and use a humidifier in your home.  We are going to cover for an infection as well.  Start Augmentin twice daily.  Please rest and drink plenty of fluids as we discussed.  If you have any worsening symptoms including coughing up significant blood, fever, nausea, vomiting ? ? ? ? ?ED Prescriptions   ? ? Medication Sig Dispense Auth. Provider  ? amoxicillin-clavulanate (AUGMENTIN) 875-125 MG tablet Take 1 tablet by mouth every 12 (twelve) hours. 14 tablet Anarely Nicholls K, PA-C  ? ?  ? ?PDMP not reviewed this encounter. ?  ?Terrilee Croak, PA-C ?11/23/21 1209 ? ?

## 2021-11-23 NOTE — Discharge Instructions (Signed)
Your x-ray was normal which is great news.  I believe that the blood that you are seeing in your sputum is related to you irritation from drinking and smoking.  Please make sure you are drinking plenty of fluid and use a humidifier in your home.  We are going to cover for an infection as well.  Start Augmentin twice daily.  Please rest and drink plenty of fluids as we discussed.  If you have any worsening symptoms including coughing up significant blood, fever, nausea, vomiting ?

## 2021-11-23 NOTE — ED Triage Notes (Signed)
Patient reports coughing up blood and mucus.  Reports throat hurts and says he thinks he drank too much  ?

## 2022-01-27 ENCOUNTER — Ambulatory Visit
Admission: EM | Admit: 2022-01-27 | Discharge: 2022-01-27 | Disposition: A | Discharge status: Home / Self Care | Payer: BC Managed Care – PPO | Attending: Emergency Medicine | Admitting: Emergency Medicine

## 2022-01-27 DIAGNOSIS — Z202 Contact with and (suspected) exposure to infections with a predominantly sexual mode of transmission: Secondary | ICD-10-CM | POA: Insufficient documentation

## 2022-01-27 DIAGNOSIS — Z113 Encounter for screening for infections with a predominantly sexual mode of transmission: Secondary | ICD-10-CM | POA: Insufficient documentation

## 2022-01-27 MED ORDER — CEFTRIAXONE SODIUM 500 MG IJ SOLR
500.0000 mg | Freq: Once | INTRAMUSCULAR | Status: AC
  Administered 2022-01-27: 500 mg via INTRAMUSCULAR

## 2022-01-27 MED ORDER — DOXYCYCLINE HYCLATE 100 MG PO CAPS: 100.0000 mg | ORAL_CAPSULE | Freq: Two times a day (BID) | ORAL | 0 refills | Status: AC

## 2022-01-27 NOTE — ED Triage Notes (Signed)
Patient c/o frequent urination with irritation that began Saturday.

## 2022-01-27 NOTE — Discharge Instructions (Signed)
The results of your STD testing today which screens for gonorrhea, chlamydia, and trichomonas will be made available to you once it is complete.  This typically takes 3 to 5 days.  Please note that we do not test for herpes virus unless you are having an active lesion concerning for herpes outbreak.  Please abstain from sexual intercourse of any kind, vaginal, oral or anal, until you have received the results of your STD testing.     Based on the symptoms and concerns you shared with me today, you were treated for presumed gonorrhea with an injection of ceftriaxone 500 mg.  This is the only treatment you will need for gonorrhea.  Please abstain from sexual intercourse of any kind, vaginal, oral or anal, for 7 days.   Based on the symptoms and concerns you shared with me today, you were treated for presumed chlamydia with a prescription for doxycycline, 1 tablet twice daily for the next 7 days.  Please take all tablets as prescribed, do not skip doses.  Failure to take all doses as prescribed can result in a worsening infection that will be more difficult to treat and resolve.  Please abstain from sexual intercourse of any kind, vaginal, oral or anal, for 7 days.   The results of your STD testing today which tests for gonorrhea, chlamydia and trichomonas will be posted to your MyChart account in the next 3 to 5 days.  If any of your results are abnormal, you will receive a phone call regarding further treatment.  Additional prescriptions, if any are needed, will be provided for you at your pharmacy.   Please abstain from sexual intercourse of any kind, vaginal, oral or anal, until until you have received the results of your STD testing.   Please remember that the only way to prevent transmission of sexually transmitted disease when having sexual intercourse is to use condoms.  Repeat sexually transmitted infections can cause scarring of the tubes that carry sperm from your testicles to your penis during  ejaculation.  This can interfere with your your ability to have children.  Repeat exposures to sexually transmitted diseases can also increase your risk of contracting HIV as well as HPV, human papilloma virus which causes genital warts.   If you have not had complete resolution of your symptoms after completing treatment, please return for repeat evaluation.   Thank you for visiting urgent care today.  I appreciate the opportunity to participate in your care.

## 2022-01-27 NOTE — ED Provider Notes (Signed)
[facility]    CSN: [id number] Arrival date & time: 01/27/22  1632    HISTORY   Chief Complaint  Patient presents with   SEXUALLY TRANSMITTED DISEASE   HPI Zachary Mccarty is a 26 y.o. male. Patient presents to urgent care stating that he engaged in vaginal sexual intercourse with a new partner, did not use a condom, this was 3 days ago.  States that the next day he began to notice that he was having increased frequency of urination and mild burning with urination.  Patient denies testicular pain, testicular swelling, scrotal pain, scrotal swelling, genital lesions, penile discharge.  The history is provided by the patient.   History reviewed. No pertinent past medical history. 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 No pertinent surgical history.  Home Medications    Prior to Admission medications   Medication Sig Start Date End Date Taking? Authorizing Provider  amoxicillin-clavulanate (AUGMENTIN) 875-125 MG tablet Take 1 tablet by mouth every 12 (twelve) hours. 11/23/21   Raspet, Noberto Retort, PA-C   Family History Family History  Problem Relation Age of Onset   Healthy Mother    Healthy Father    Social History Social History   Tobacco Use   Smoking status: Some Days    Packs/day: 1.00    Types: Cigarettes   Smokeless tobacco: Never   Tobacco comments:    2 cigs a day  Vaping Use   Vaping Use: Never used  Substance Use Topics   Alcohol use: Yes    Comment: occasionally   Drug use: Not Currently   Allergies   Patient has no known allergies.  Review of Systems Review of Systems Pertinent findings noted in history of present illness.   Physical Exam Triage Vital  Signs ED Triage Vitals  Enc Vitals Group     BP 05/17/21 0827 (!) 147/82     Pulse Rate 05/17/21 0827 72     Resp 05/17/21 0827 18     Temp 05/17/21 0827 98.3 F (36.8 C)     Temp Source 05/17/21 0827 Oral     SpO2 05/17/21 0827 98 %     Weight --      Height --      Head Circumference --      Peak Flow --      Pain Score 05/17/21 0826 5     Pain Loc --      Pain Edu? --      Excl. in GC? --   No data found.  Updated Vital Signs BP 127/78 (BP Location: Left Arm)   Pulse 78   Temp 98.2 F (36.8 C) (Oral)   Resp 18   SpO2 96%   Physical Exam Vitals and nursing note reviewed.  Constitutional:      General: He is not in acute distress.    Appearance: Normal appearance. He is not ill-appearing.  HENT:     Head: Normocephalic and atraumatic.  Eyes:     General: Lids are normal.        Right eye: No discharge.        Left eye: No discharge.     Extraocular Movements: Extraocular movements intact.     Conjunctiva/sclera: Conjunctivae  normal.     Right eye: Right conjunctiva is not injected.     Left eye: Left conjunctiva is not injected.  Neck:     Trachea: Trachea and phonation normal.  Cardiovascular:     Rate and Rhythm: Normal rate and regular rhythm.     Pulses: Normal pulses.     Heart sounds: Normal heart sounds. No murmur heard.    No friction rub. No gallop.  Pulmonary:     Effort: Pulmonary effort is normal. No accessory muscle usage, prolonged expiration or respiratory distress.     Breath sounds: Normal breath sounds. No stridor, decreased air movement or transmitted upper airway sounds. No decreased breath sounds, wheezing, rhonchi or rales.  Chest:     Chest wall: No tenderness.  Genitourinary:    Comments: Pt politely declines GU exam, pt did provide a penile swab for testing.   Musculoskeletal:        General: Normal range of motion.     Cervical back: Normal range of motion and neck supple. Normal range of motion.  Lymphadenopathy:     Cervical:  No cervical adenopathy.  Skin:    General: Skin is warm and dry.     Findings: No erythema or rash.  Neurological:     General: No focal deficit present.     Mental Status: He is alert and oriented to person, place, and time.  Psychiatric:        Mood and Affect: Mood normal.        Behavior: Behavior normal.     Visual Acuity Right Eye Distance:   Left Eye Distance:   Bilateral Distance:    Right Eye Near:   Left Eye Near:    Bilateral Near:     UC Couse / Diagnostics / Procedures:    EKG  Radiology No results found.  Procedures Procedures (including critical care time)  UC Diagnoses / Final Clinical Impressions(s)   I have reviewed the triage vital signs and the nursing notes.  Pertinent labs & imaging results that were available during my care of the patient were reviewed by me and considered in my medical decision making (see chart for details).    Final diagnoses:  Screening examination for STD (sexually transmitted disease)  Possible exposure to STD   Patient was provided with ceftriaxone for empiric treatment of presumed GC based on the history provided to me today.   Patient was provided with Doxycycline 100 mg twice daily for 7 days for empiric treatment of presumed chlamydia based on the history provided to me today.   STD screening was performed, patient advised that the results be posted to their MyChart and if any of the results are positive, they will be notified by phone, further treatment will be provided as indicated based on results of STD screening. Patient was advised to abstain from sexual intercourse until that they receive the results of their STD testing.  Patient was also advised to use condoms to protect themselves from STD exposure. Return precautions advised.  Drug allergies reviewed, all questions addressed.     ED Prescriptions     Medication Sig Dispense Auth. Provider   doxycycline (VIBRAMYCIN) 100 MG capsule Take 1 capsule (100 mg  total) by mouth 2 (two) times daily for 7 days. 14 capsule Theadora Rama Scales, PA-C      PDMP not reviewed this encounter.  Pending results:  Labs Reviewed  CYTOLOGY, (ORAL, ANAL, URETHRAL) ANCILLARY ONLY    Medications Ordered in  UC: Medications  cefTRIAXone (ROCEPHIN) injection 500 mg (500 mg Intramuscular Given 01/27/22 1823)    Disposition Upon Discharge:  Condition: stable for discharge home  Patient presented with concern for an acute illness with associated systemic symptoms and significant discomfort requiring urgent management. In my opinion, this is a condition that a prudent lay person (someone who possesses an average knowledge of health and medicine) may potentially expect to result in complications if not addressed urgently such as respiratory distress, impairment of bodily function or dysfunction of bodily organs.   As such, the patient has been evaluated and assessed, work-up was performed and treatment was provided in alignment with urgent care protocols and evidence based medicine.  Patient/parent/caregiver has been advised that the patient may require follow up for further testing and/or treatment if the symptoms continue in spite of treatment, as clinically indicated and appropriate.  Routine symptom specific, illness specific and/or disease specific instructions were discussed with the patient and/or caregiver at length.  Prevention strategies for avoiding STD exposure were also discussed.  The patient will follow up with their current PCP if and as advised. If the patient does not currently have a PCP we will assist them in obtaining one.   The patient may need specialty follow up if the symptoms continue, in spite of conservative treatment and management, for further workup, evaluation, consultation and treatment as clinically indicated and appropriate.  Patient/parent/caregiver verbalized understanding and agreement of plan as discussed.  All questions were  addressed during visit.  Please see discharge instructions below for further details of plan.  Discharge Instructions:   Discharge Instructions      The results of your STD testing today which screens for gonorrhea, chlamydia, and trichomonas will be made available to you once it is complete.  This typically takes 3 to 5 days.  Please note that we do not test for herpes virus unless you are having an active lesion concerning for herpes outbreak.  Please abstain from sexual intercourse of any kind, vaginal, oral or anal, until you have received the results of your STD testing.     Based on the symptoms and concerns you shared with me today, you were treated for presumed gonorrhea with an injection of ceftriaxone 500 mg.  This is the only treatment you will need for gonorrhea.  Please abstain from sexual intercourse of any kind, vaginal, oral or anal, for 7 days.   Based on the symptoms and concerns you shared with me today, you were treated for presumed chlamydia with a prescription for doxycycline, 1 tablet twice daily for the next 7 days.  Please take all tablets as prescribed, do not skip doses.  Failure to take all doses as prescribed can result in a worsening infection that will be more difficult to treat and resolve.  Please abstain from sexual intercourse of any kind, vaginal, oral or anal, for 7 days.   The results of your STD testing today which tests for gonorrhea, chlamydia and trichomonas will be posted to your MyChart account in the next 3 to 5 days.  If any of your results are abnormal, you will receive a phone call regarding further treatment.  Additional prescriptions, if any are needed, will be provided for you at your pharmacy.   Please abstain from sexual intercourse of any kind, vaginal, oral or anal, until until you have received the results of your STD testing.   Please remember that the only way to prevent transmission of sexually transmitted disease when having  sexual  intercourse is to use condoms.  Repeat sexually transmitted infections can cause scarring of the tubes that carry sperm from your testicles to your penis during ejaculation.  This can interfere with your your ability to have children.  Repeat exposures to sexually transmitted diseases can also increase your risk of contracting HIV as well as HPV, human papilloma virus which causes genital warts.   If you have not had complete resolution of your symptoms after completing treatment, please return for repeat evaluation.   Thank you for visiting urgent care today.  I appreciate the opportunity to participate in your care.       This office note has been dictated using Teaching laboratory technician.  Unfortunately, and despite my best efforts, this method of dictation can sometimes lead to occasional typographical or grammatical errors.  I apologize in advance if this occurs.      Theadora Rama Scales, [place] 01/28/22 [phone]

## 2022-01-29 LAB — CYTOLOGY, (ORAL, ANAL, URETHRAL) ANCILLARY ONLY
Chlamydia: NEGATIVE
Comment: NEGATIVE
Comment: NEGATIVE
Comment: NORMAL
Neisseria Gonorrhea: NEGATIVE
Trichomonas: NEGATIVE

## 2022-03-16 ENCOUNTER — Ambulatory Visit: Payer: Self-pay

## 2022-03-19 ENCOUNTER — Ambulatory Visit
Admission: RE | Admit: 2022-03-19 | Discharge: 2022-03-19 | Disposition: A | Discharge status: Home / Self Care | Payer: BC Managed Care – PPO | Source: Ambulatory Visit | Attending: Physician Assistant | Admitting: Physician Assistant

## 2022-03-19 VITALS — BP 124/77 | HR 74 | Temp 98.5°F | Resp 18

## 2022-03-19 DIAGNOSIS — R350 Frequency of micturition: Secondary | ICD-10-CM | POA: Insufficient documentation

## 2022-03-19 DIAGNOSIS — Z113 Encounter for screening for infections with a predominantly sexual mode of transmission: Secondary | ICD-10-CM | POA: Diagnosis present

## 2022-03-19 LAB — POCT URINALYSIS DIP (MANUAL ENTRY)
Bilirubin, UA: NEGATIVE
Blood, UA: NEGATIVE
Glucose, UA: NEGATIVE mg/dL
Ketones, POC UA: NEGATIVE mg/dL
Leukocytes, UA: NEGATIVE
Nitrite, UA: NEGATIVE
Protein Ur, POC: NEGATIVE mg/dL
Spec Grav, UA: 1.025 (ref 1.010–1.025)
Urobilinogen, UA: 0.2 E.U./dL
pH, UA: 5.5 (ref 5.0–8.0)

## 2022-03-19 NOTE — Discharge Instructions (Signed)
Please abstain from sex until results are available.  We will contact you if we need to arrange any treatment.  Monitor your MyChart for these results.  Use a condom with each sexual encounter.  If you develop any additional symptoms including abdominal pain, pelvic pain, fever, burning when you pee you should be seen immediately.

## 2022-03-19 NOTE — ED Provider Notes (Signed)
[facility]    CSN: [id number] Arrival date & time: 03/19/22  0951      History   Chief Complaint Chief Complaint  Patient presents with   Exposure to STD    Entered by patient   Urinary Frequency    HPI Zachary Mccarty is a 26 y.o. male.   Patient presents today with a several day history of urinary frequency.  Denies additional symptoms including urgency, pelvic pain, fever, nausea, vomiting, dysuria, penile discharge, testicular pain.  He is sexually active with male partners and does report an unprotected encounter where the condom slipped off last week.  He has been seen for similar symptoms in the past with last episode 01/27/2022 at which point he received treatment for urethritis.  At that time he was negative on STI swab.  He has no specific concern for HIV or syphilis and declined testing today.  Denies history of diabetes or recurrent urinary tract infections.    History reviewed. No pertinent past medical history.  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 No pertinent surgical history.     Home Medications    Prior to Admission medications   Not on File    Family History Family History  Problem Relation Age of Onset   Healthy Mother    Healthy Father     Social History Social History   Tobacco Use   Smoking status: Some Days    Packs/day: 1.00    Types: Cigarettes   Smokeless tobacco: Never   Tobacco comments:    2 cigs a day  Vaping Use   Vaping Use: Never used  Substance Use Topics   Alcohol use: Yes    Comment: occasionally   Drug use: Not Currently     Allergies   Patient has no known allergies.   Review of Systems Review of  Systems  Constitutional:  Negative for activity change, appetite change, fatigue and fever.  Gastrointestinal:  Negative for abdominal pain, diarrhea, nausea and vomiting.  Genitourinary:  Positive for frequency. Negative for dysuria, penile discharge, penile pain, penile swelling and urgency.     Physical Exam Triage Vital Signs ED Triage Vitals [03/19/22 1031]  Enc Vitals Group     BP 124/77     Pulse Rate 74     Resp 18     Temp 98.5 F (36.9 C)     Temp Source Oral     SpO2 98 %     Weight      Height      Head Circumference      Peak Flow      Pain Score 0     Pain Loc      Pain Edu?      Excl. in GC?    No data found.  Updated Vital Signs BP 124/77 (BP Location: Left Arm)   Pulse 74   Temp 98.5 F (36.9 C) (Oral)   Resp 18   SpO2 98%   Visual Acuity Right Eye Distance:   Left Eye Distance:   Bilateral Distance:    Right Eye Near:   Left Eye Near:    Bilateral Near:     Physical Exam Vitals reviewed.  Constitutional:  General: He is awake.     Appearance: Normal appearance. He is well-developed. He is not ill-appearing.     Comments: Very pleasant male appears stated age in no acute distress sitting comfortably in exam room  HENT:     Head: Normocephalic and atraumatic.     Mouth/Throat:     Pharynx: No oropharyngeal exudate, posterior oropharyngeal erythema or uvula swelling.  Cardiovascular:     Rate and Rhythm: Normal rate and regular rhythm.     Heart sounds: Normal heart sounds, S1 normal and S2 normal. No murmur heard. Pulmonary:     Effort: Pulmonary effort is normal.     Breath sounds: Normal breath sounds. No stridor. No wheezing, rhonchi or rales.     Comments: Clear to auscultation bilaterally Abdominal:     General: Bowel sounds are normal.     Palpations: Abdomen is soft.     Tenderness: There is no abdominal tenderness. There is no right CVA tenderness, left CVA tenderness, guarding or rebound.     Comments: Benign abdominal  exam  Genitourinary:    Comments: Exam deferred Neurological:     Mental Status: He is alert.  Psychiatric:        Behavior: Behavior is cooperative.      UC Treatments / Results  Labs (all labs ordered are listed, but only abnormal results are displayed) Labs Reviewed  POCT URINALYSIS DIP (MANUAL ENTRY)  CYTOLOGY, (ORAL, ANAL, URETHRAL) ANCILLARY ONLY    EKG   Radiology No results found.  Procedures Procedures (including critical care time)  Medications Ordered in UC Medications - No data to display  Initial Impression / Assessment and Plan / UC Course  I have reviewed the triage vital signs and the nursing notes.  Pertinent labs & imaging results that were available during my care of the patient were reviewed by me and considered in my medical decision making (see chart for details).     Patient is well-appearing, afebrile, nontoxic, nontachycardic.  No negation for emergent evaluation or imaging based on clinical presentation today.  UA was negative.  Discussed that given patient has had similar symptoms in the past and been negative for STI I would recommend deferring treatment.  He is agreeable to this and will send off STI swab but defer antibiotics until results are available.  Offered HIV and syphilis testing which patient declined.  Recommended that he drink plenty of fluid.  Discussed that if he develops any additional symptoms he should return for reevaluation including dysuria, penile discharge, abdominal pain, fever, nausea, vomiting.  Discussed the importance of safe sex practices.  He was provided sex education packet including condoms.  Final Clinical Impressions(s) / UC Diagnoses   Final diagnoses:  Routine screening for STI (sexually transmitted infection)  Urinary frequency     Discharge Instructions      Please abstain from sex until results are available.  We will contact you if we need to arrange any treatment.  Monitor your MyChart for these  results.  Use a condom with each sexual encounter.  If you develop any additional symptoms including abdominal pain, pelvic pain, fever, burning when you pee you should be seen immediately.     ED Prescriptions   None    PDMP not reviewed this encounter.   Jeani Hawking, PA-C 03/19/22 1050

## 2022-03-19 NOTE — ED Triage Notes (Signed)
Pt c/o urinary frequency, pelvic pain after intercourse with new partner. States the volume of urine is sometimes normal sometimes less. Denies discharge, hematuria, lower back pain, skin lesions. Onset ~ Saturday.   Denies exposure. Denies pain.

## 2022-03-20 LAB — CYTOLOGY, (ORAL, ANAL, URETHRAL) ANCILLARY ONLY
Chlamydia: NEGATIVE
Comment: NEGATIVE
Comment: NEGATIVE
Comment: NORMAL
Neisseria Gonorrhea: NEGATIVE
Trichomonas: NEGATIVE

## 2022-05-25 ENCOUNTER — Ambulatory Visit
Admission: RE | Admit: 2022-05-25 | Discharge: 2022-05-25 | Disposition: A | Discharge status: Home / Self Care | Payer: BC Managed Care – PPO | Source: Ambulatory Visit | Attending: Internal Medicine | Admitting: Internal Medicine

## 2022-05-25 VITALS — BP 148/84 | HR 99 | Temp 98.7°F | Resp 16

## 2022-05-25 DIAGNOSIS — L0231 Cutaneous abscess of buttock: Secondary | ICD-10-CM

## 2022-05-25 MED ORDER — DOXYCYCLINE HYCLATE 100 MG PO CAPS: 100.0000 mg | ORAL_CAPSULE | Freq: Two times a day (BID) | ORAL | 0 refills | Status: DC

## 2022-05-25 NOTE — Discharge Instructions (Signed)
You have an abscess of your bottom.  This is being treated with antibiotic.  Take medication with food to avoid nausea.  Follow-up if symptoms persist or worsen.

## 2022-05-25 NOTE — ED Provider Notes (Signed)
[facility]    CSN: [id number] Arrival date & time: 05/25/22  1207      History   Chief Complaint Chief Complaint  Patient presents with   Abscess    Entered by patient    HPI Zachary Mccarty is a 26 y.o. male.   Patient presents with concern for abscess to right buttocks.  He states that he noticed this about 3 weeks ago but it has seemed to worsen over the past few days and is more painful.  Denies any drainage from the area.  Denies history of this.  Denies fever, body aches, chills.   Abscess   History reviewed. No pertinent past medical history.  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 No pertinent surgical history.     Home Medications    Prior to Admission medications   Medication Sig Start Date End Date Taking? Authorizing Provider  doxycycline (VIBRAMYCIN) 100 MG capsule Take 1 capsule (100 mg total) by mouth 2 (two) times daily. 05/25/22  Yes Joyia Riehle, Acie Fredrickson, FNP    Family History Family History  Problem Relation Age of Onset   Healthy Mother    Healthy Father     Social History Social History   Tobacco Use   Smoking status: Some Days    Packs/day: 1.00    Types: Cigarettes   Smokeless tobacco: Never   Tobacco comments:    2 cigs a day  Vaping Use   Vaping Use: Never used  Substance Use Topics   Alcohol use: Yes    Comment: occasionally   Drug use: Not Currently     Allergies   Patient has no known allergies.   Review of Systems Review of Systems Per HPI  Physical Exam Triage Vital Signs ED Triage Vitals  Enc Vitals Group     BP 05/25/22 1218 (!) 148/84     Pulse Rate 05/25/22 1218 99     Resp 05/25/22 1218 16     Temp  05/25/22 1218 98.7 F (37.1 C)     Temp Source 05/25/22 1218 Oral     SpO2 05/25/22 1218 98 %     Weight --      Height --      Head Circumference --      Peak Flow --      Pain Score 05/25/22 1219 8     Pain Loc --      Pain Edu? --      Excl. in GC? --    No data found.  Updated Vital Signs BP (!) 148/84 (BP Location: Left Arm)   Pulse 99   Temp 98.7 F (37.1 C) (Oral)   Resp 16   SpO2 98%   Visual Acuity Right Eye Distance:   Left Eye Distance:   Bilateral Distance:    Right Eye Near:   Left Eye Near:    Bilateral Near:     Physical Exam Exam conducted with a chaperone present.  Constitutional:      General: He is not in acute distress.    Appearance: Normal appearance. He is not toxic-appearing or diaphoretic.  HENT:     Head: Normocephalic and atraumatic.  Eyes:     Extraocular Movements:  Extraocular movements intact.     Conjunctiva/sclera: Conjunctivae normal.  Pulmonary:     Effort: Pulmonary effort is normal.  Genitourinary:      Comments: Patient has approximately 1 cm in diameter indurated abscess to right inner buttocks. Neurological:     General: No focal deficit present.     Mental Status: He is alert and oriented to person, place, and time. Mental status is at baseline.  Psychiatric:        Mood and Affect: Mood normal.        Behavior: Behavior normal.        Thought Content: Thought content normal.        Judgment: Judgment normal.      UC Treatments / Results  Labs (all labs ordered are listed, but only abnormal results are displayed) Labs Reviewed - No data to display  EKG   Radiology No results found.  Procedures Procedures (including critical care time)  Medications Ordered in UC Medications - No data to display  Initial Impression / Assessment and Plan / UC Course  I have reviewed the triage vital signs and the nursing notes.  Pertinent labs & imaging results that were available during my care of the patient were  reviewed by me and considered in my medical decision making (see chart for details).     Patient has abscess and will treat with doxycycline antibiotic.  Patient advised to take this medication with food.  Abscess is not conducive to drainage given that it is very indurated.  Patient advised to use warm compresses as well.  Advised patient to follow-up if symptoms persist or worsen.  Patient verbalized understanding and was agreeable with plan. Final Clinical Impressions(s) / UC Diagnoses   Final diagnoses:  Abscess of buttock     Discharge Instructions      You have an abscess of your bottom.  This is being treated with antibiotic.  Take medication with food to avoid nausea.  Follow-up if symptoms persist or worsen.    ED Prescriptions     Medication Sig Dispense Auth. Provider   doxycycline (VIBRAMYCIN) 100 MG capsule Take 1 capsule (100 mg total) by mouth 2 (two) times daily. 20 capsule Teodora Medici, [place]      PDMP not reviewed this encounter.   Teodora Medici, [place] 05/25/22 1340

## 2022-05-25 NOTE — ED Triage Notes (Signed)
Pt c/o abscess to between gluteals near the anus since ~ wed. States it is bigger and more firm. Denies drainage.

## 2022-07-02 ENCOUNTER — Ambulatory Visit: Payer: BC Managed Care – PPO | Admitting: Nurse Practitioner

## 2023-02-08 ENCOUNTER — Inpatient Hospital Stay: Admission: RE | Admit: 2023-02-08 | Payer: Self-pay | Source: Ambulatory Visit

## 2023-02-08 ENCOUNTER — Ambulatory Visit
Admission: EM | Admit: 2023-02-08 | Discharge: 2023-02-08 | Disposition: A | Discharge status: Home / Self Care | Payer: BC Managed Care – PPO | Attending: Emergency Medicine | Admitting: Emergency Medicine

## 2023-02-08 DIAGNOSIS — Z202 Contact with and (suspected) exposure to infections with a predominantly sexual mode of transmission: Secondary | ICD-10-CM | POA: Insufficient documentation

## 2023-02-08 DIAGNOSIS — R369 Urethral discharge, unspecified: Secondary | ICD-10-CM | POA: Insufficient documentation

## 2023-02-08 MED ORDER — DOXYCYCLINE HYCLATE 100 MG PO CAPS: 100.0000 mg | ORAL_CAPSULE | Freq: Two times a day (BID) | ORAL | 0 refills | Status: AC

## 2023-02-08 MED ORDER — CEFTRIAXONE SODIUM 500 MG IJ SOLR
500.0000 mg | INTRAMUSCULAR | Status: DC
  Administered 2023-02-08: 500 mg via INTRAMUSCULAR

## 2023-02-08 NOTE — ED Provider Notes (Signed)
HPI  SUBJECTIVE:  Zachary Mccarty is a 27 y.o. male who presents with 3 days of yellow penile discharge, dysuria, urgency, frequency.  He states that he has felt feverish, but has not checked his temperature.  No cloudy or odorous urine, hematuria, genital rash/ pain, testicular swelling or pain.  No nausea, vomiting, abdominal, back, pelvic pain.  He recently had intercourse with a new male partner, who was diagnosed with gonorrhea.  He also has another male sexual partner who is here today for testing.  No aggravating or alleviating factors.  He has not tried anything for symptoms.  He has a past medical history of gonorrhea, chlamydia, HSV.  No history of HIV, syphilis, trichomonas, diabetes, UTI.  PCP: [facility]    History reviewed. No pertinent past medical history.  History reviewed. No pertinent surgical history.  Family History  Problem Relation Age of Onset   Healthy Mother    Healthy Father     Social History   Tobacco Use   Smoking status: Every Day    Types: Cigars   Smokeless tobacco: Never   Tobacco comments:    2 cigs a day  Vaping Use   Vaping status: Never Used  Substance Use Topics   Alcohol use: Yes    Comment: occasionally   Drug use: Not Currently    No current facility-administered medications for this encounter.  Current Outpatient Medications:    doxycycline (VIBRAMYCIN) 100 MG capsule, Take 1 capsule (100 mg total) by mouth 2 (two) times daily for 7 days., Disp: 14 capsule, Rfl: 0  No Known Allergies   ROS  As noted in HPI.   Physical Exam  BP 136/87 (BP Location: Right Arm)   Pulse 83   Temp 99 F (37.2 C) (Oral)   Resp 18   SpO2 97%   Constitutional: Well developed, well nourished, no acute distress Eyes:  EOMI, conjunctiva normal bilaterally HENT: Normocephalic, atraumatic,mucus membranes moist Respiratory: Normal inspiratory effort Cardiovascular: Normal rate GI: nondistended soft, nontender.  No suprapubic, flank  tenderness Back: No CVAT GU: Normal circumcised male, testes descended bilaterally.  No epididymal, testicular swelling or tenderness.  No penile rash.  No appreciable penile discharge.  Patient declined chaperone. Lymph: No inguinal lymphadenopathy skin: No rash, skin intact Musculoskeletal: no deformities Neurologic: Alert & oriented x 3, no focal neuro deficits Psychiatric: Speech and behavior appropriate   ED Course   Medications - No data to display   No orders of the defined types were placed in this encounter.   No results found for this or any previous visit (from the past 24 hour(s)). No results found.  ED Clinical Impression  1. Penile discharge   2. Exposure to gonorrhea      ED Assessment/Plan     Patient presents with penile discharge and urinary symptoms after being exposed to gonorrhea.  He would like to be empirically treated for gonorrhea today.  Will give Rocephin 500 mg IM x 1.  He would also like treatment for chlamydia.  Home with doxycycline 100 mg p.o. twice daily for 7 days.  Patient declined HIV, syphilis testing.  Sending off swab for gonorrhea, chlamydia, trichomonas.  Patient states that it was a good swab, he was able to get some discharge on it.  Deferring UA given penile discharge and exposure to gonorrhea as I doubt that he has a concurrent UTI.  He has never had a UTI before.  Follow-up with PCP as needed.  Advised patient to refrain  from intercourse until all of his labs have been resulted, symptoms resolved, and he and his partners have both been treated.   Discussed labs, iMDM, treatment plan, and plan for follow-up with patient. . patient agrees with plan.   Meds ordered this encounter  Medications   DISCONTD: cefTRIAXone (ROCEPHIN) injection 500 mg    Order Specific Question:   Antibiotic Indication:    Answer:   STD   doxycycline (VIBRAMYCIN) 100 MG capsule    Sig: Take 1 capsule (100 mg total) by mouth 2 (two) times daily for 7 days.     Dispense:  14 capsule    Refill:  0      *This clinic note was created using Scientist, clinical (histocompatibility and immunogenetics). Therefore, there may be occasional mistakes despite careful proofreading.  ?    Domenick Gong, MD 02/09/23 1126

## 2023-02-08 NOTE — Discharge Instructions (Signed)
We have sent off testing for gonorrhea, chlamydia and trichomonas.  I have treated you empirically for gonorrhea with Rocephin 500 mg intramuscularly x 1.  Finish the doxycycline.  This is for chlamydia.  Give [place] a working phone number so that we can contact you if needed. Refrain from sexual contact until all of your labs have come back, symptoms have resolved, and your partner(s) are treated if necessary. Return to the ER if you get worse, have a fever >100.4, or for any concerns.   Go to www.goodrx.com  or www.costplusdrugs.com to look up your medications. This will give you a list of where you can find your prescriptions at the most affordable prices. Or ask the pharmacist what the cash price is, or if they have any other discount programs available to help make your medication more affordable. This can be less expensive than what you would pay with insurance.

## 2023-02-08 NOTE — ED Triage Notes (Signed)
Pt requesting STD testing-penile d/c and dysuria x 3 days-was advised of +gonorrhea exposure-NAD-steady gait

## 2023-02-09 LAB — CYTOLOGY, (ORAL, ANAL, URETHRAL) ANCILLARY ONLY
Chlamydia: NEGATIVE
Comment: NEGATIVE
Comment: NEGATIVE
Comment: NORMAL
Neisseria Gonorrhea: NEGATIVE
Trichomonas: NEGATIVE

## 2023-02-11 ENCOUNTER — Ambulatory Visit: Payer: BC Managed Care – PPO

## 2024-04-21 ENCOUNTER — Ambulatory Visit (HOSPITAL_COMMUNITY)
Admission: RE | Admit: 2024-04-21 | Discharge: 2024-04-21 | Disposition: A | Discharge status: Home / Self Care | Payer: Self-pay | Source: Ambulatory Visit | Attending: Family Medicine | Admitting: Family Medicine

## 2024-04-21 ENCOUNTER — Encounter (HOSPITAL_COMMUNITY): Payer: Self-pay

## 2024-04-21 VITALS — BP 125/79 | HR 86 | Temp 98.3°F | Resp 16

## 2024-04-21 DIAGNOSIS — B379 Candidiasis, unspecified: Secondary | ICD-10-CM

## 2024-04-21 DIAGNOSIS — L304 Erythema intertrigo: Secondary | ICD-10-CM

## 2024-04-21 MED ORDER — FLUCONAZOLE 100 MG PO TABS: ORAL_TABLET | ORAL | 0 refills | Status: AC

## 2024-04-21 MED ORDER — KETOCONAZOLE 2 % EX CREA: 1.0000 | TOPICAL_CREAM | Freq: Every day | CUTANEOUS | 0 refills | Status: AC

## 2024-04-21 NOTE — ED Provider Notes (Signed)
 [facility]    CSN: [id number] Arrival date & time: 04/21/24  9048      History   Chief Complaint Chief Complaint  Patient presents with   Rash    Entered by patient    HPI Zachary Mccarty is a 28 y.o. male.    Rash Here for rash and irritation and itching in his gluteal crevice.  Has been developing over about 3 or 4 weeks.  And is gotten worse in the last few days.  No fever He has been putting some hydrocortisone cream on but has not helped. 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   History reviewed. No pertinent past medical history.  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 No pertinent surgical history.     Home Medications    Prior to Admission medications   Medication Sig Start Date End Date Taking? Authorizing Provider  fluconazole (DIFLUCAN) 100 MG tablet 2 tablets by mouth the first day, then 1 tablet daily for 6 more days. 04/21/24  Yes Jaimon Bugaj K, MD  ketoconazole (NIZORAL) 2 % cream Apply 1 Application topically daily. To affected area on low back/buttock till better, up to 2 weeks 04/21/24  Yes Yeira Gulden, Sharlet POUR, MD    Family History Family History  Problem Relation Age of Onset   Healthy Mother    Healthy Father     Social History Social History   Tobacco Use   Smoking status: Every Day    Types: Cigars   Smokeless tobacco: Never   Tobacco comments:    2 cigs a day  Vaping Use   Vaping status: Never Used  Substance Use Topics   Alcohol use: Yes    Comment: occasionally   Drug use: Not Currently     Allergies   Doxycycline    Review  of Systems Review of Systems  Skin:  Positive for rash.     Physical Exam Triage Vital Signs ED Triage Vitals  Encounter Vitals Group     BP 04/21/24 1007 125/79     Girls Systolic BP Percentile --      Girls Diastolic BP Percentile --      Boys Systolic BP Percentile --      Boys Diastolic BP Percentile --      Pulse Rate 04/21/24 1007 86     Resp 04/21/24 1007 16     Temp 04/21/24 1007 98.3 F (36.8 C)     Temp Source 04/21/24 1007 Oral     SpO2 04/21/24 1007 96 %     Weight --      Height --      Head Circumference --      Peak Flow --      Pain Score 04/21/24 1008 3     Pain Loc --      Pain Education --      Exclude from Growth Chart --    No data found.  Updated Vital Signs BP 125/79 (BP Location: Left Arm)   Pulse 86   Temp 98.3 F (36.8 C) (Oral)   Resp 16   SpO2 96%  Visual Acuity Right Eye Distance:   Left Eye Distance:   Bilateral Distance:    Right Eye Near:   Left Eye Near:    Bilateral Near:     Physical Exam Vitals reviewed.  Constitutional:      General: He is not in acute distress.    Appearance: He is not ill-appearing, toxic-appearing or diaphoretic.  Genitourinary:    Comments: Chaperone is present during exam.  There is a confluence erythematous rash without any fluctuance or induration.  It is mildly bumpy.  It is present on both sides of the gluteal crevice. Skin:    Coloration: Skin is not pale.  Neurological:     Mental Status: He is alert and oriented to person, place, and time.  Psychiatric:        Behavior: Behavior normal.      UC Treatments / Results  Labs (all labs ordered are listed, but only abnormal results are displayed) Labs Reviewed - No data to display  EKG   Radiology No results found.  Procedures Procedures (including critical care time)  Medications Ordered in UC Medications - No data to display  Initial Impression / Assessment and Plan / UC Course  I have reviewed the triage vital signs and  the nursing notes.  Pertinent labs & imaging results that were available during my care of the patient were reviewed by me and considered in my medical decision making (see chart for details).     Nizoral cream is sent in along with 5 days of fluconazole for candidiasis and intertrigo.  We discussed measures to keep his skin there drier so that it will be easier to treat and resolved. Final Clinical Impressions(s) / UC Diagnoses   Final diagnoses:  Candidiasis  Intertrigo     Discharge Instructions      Ketoconazole cream--apply once daily to the red area till better.  Fluconazole 100 mg--2 tablets by mouth the first day then 1 tablet by mouth daily for 6 more days.  Try to stay as dry as possible and change underwear frequently if it gets sweaty.     ED Prescriptions     Medication Sig Dispense Auth. Provider   ketoconazole (NIZORAL) 2 % cream Apply 1 Application topically daily. To affected area on low back/buttock till better, up to 2 weeks 60 g Terrilee Dudzik K, MD   fluconazole (DIFLUCAN) 100 MG tablet 2 tablets by mouth the first day, then 1 tablet daily for 6 more days. 8 tablet Quana Chamberlain K, MD      PDMP not reviewed this encounter.   Vonna Sharlet POUR, MD 04/21/24 1025

## 2024-04-21 NOTE — ED Triage Notes (Signed)
 Pt reports a rash on both sides of the buttocks x 3 weeks. States normally when wiping hard the rash will appear and go away after a day but this time the rash has been consistent. Reports the rash is red, itchy and painful, denies any drainage. Has tried Hydrocortisone with no relief.

## 2024-04-21 NOTE — Discharge Instructions (Addendum)
 Ketoconazole cream--apply once daily to the red area till better.  Fluconazole 100 mg--2 tablets by mouth the first day then 1 tablet by mouth daily for 6 more days.  Try to stay as dry as possible and change underwear frequently if it gets sweaty.

## 2024-05-02 ENCOUNTER — Ambulatory Visit (HOSPITAL_COMMUNITY): Admission: EM | Admit: 2024-05-02 | Discharge: 2024-05-02 | Disposition: A | Discharge status: Home / Self Care

## 2024-05-02 ENCOUNTER — Encounter (HOSPITAL_COMMUNITY): Payer: Self-pay

## 2024-05-02 DIAGNOSIS — R300 Dysuria: Secondary | ICD-10-CM | POA: Diagnosis present

## 2024-05-02 DIAGNOSIS — Z202 Contact with and (suspected) exposure to infections with a predominantly sexual mode of transmission: Secondary | ICD-10-CM | POA: Insufficient documentation

## 2024-05-02 MED ORDER — CEFTRIAXONE SODIUM 500 MG IJ SOLR
500.0000 mg | INTRAMUSCULAR | Status: DC
  Administered 2024-05-02: 500 mg via INTRAMUSCULAR

## 2024-05-02 MED ORDER — LIDOCAINE HCL (PF) 1 % IJ SOLN
INTRAMUSCULAR | Status: AC
  Filled 2024-05-02: qty 2

## 2024-05-02 MED ORDER — AZITHROMYCIN 250 MG PO TABS: 1000.0000 mg | ORAL_TABLET | Freq: Once | ORAL | 0 refills | Status: AC

## 2024-05-02 MED ORDER — CEFTRIAXONE SODIUM 500 MG IJ SOLR
INTRAMUSCULAR | Status: AC
  Filled 2024-05-02: qty 500

## 2024-05-02 NOTE — ED Triage Notes (Signed)
 Patient reports that he is having stinging when urinating x 2 days. Patient is requesting STD testing.

## 2024-05-02 NOTE — Discharge Instructions (Signed)
 You were tested for STIs today. You should receive your results in 3-5 days. If you have not received a call from the office or see results in your mychart please call the clinic where you were seen. It is best if your refrain from sexual activity until you get results back. If positive you will need to refrain from sexual activity for 2 weeks and be sure to complete any antibiotics prescribed in their entirety. '

## 2024-05-03 LAB — CYTOLOGY, (ORAL, ANAL, URETHRAL) ANCILLARY ONLY
Chlamydia: NEGATIVE
Comment: NEGATIVE
Comment: NEGATIVE
Comment: NORMAL
Neisseria Gonorrhea: NEGATIVE
Trichomonas: NEGATIVE

## 2024-05-03 NOTE — ED Provider Notes (Signed)
 [facility]    CSN: [id number] Arrival date & time: 05/02/24  1943      History   Chief Complaint Chief Complaint  Patient presents with   SEXUALLY TRANSMITTED DISEASE    HPI Zachary Mccarty is a 28 y.o. male.   Patient presents today due to dysuria for 2 days.  Patient states that he recently went to homecoming activities and admits to unprotected sex.  Patient denies penile discharge, testicular pain, back pain, or lower abdominal pain.  Patient advised that it may be too early to test for STIs from the weekend but will prophylactically treat him.     History reviewed. No pertinent past medical history.  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 No pertinent surgical history.     Home Medications    Prior to Admission medications   Medication Sig Start Date End Date Taking? Authorizing Provider  fluconazole (DIFLUCAN) 100 MG tablet 2 tablets by mouth the first day, then 1 tablet daily for 6 more days. Patient not taking: Reported on 05/02/2024 04/21/24   Banister, Pamela K, MD  ketoconazole (NIZORAL) 2 % cream Apply 1 Application topically daily. To affected area on low back/buttock till better, up to 2 weeks Patient not taking: Reported on 05/02/2024 04/21/24   Vonna Sharlet POUR, MD    Family History Family History  Problem Relation Age of Onset   Healthy Mother    Healthy Father     Social History Social History   Tobacco Use   Smoking status: Every Day    Types: Cigars   Smokeless tobacco: Never   Tobacco comments:    2 cigs a day  Vaping Use   Vaping status: Never Used  Substance Use Topics   Alcohol use: Yes    Comment: occasionally   Drug use: Not  Currently     Allergies   Doxycycline    Review of Systems Review of Systems   Physical Exam Triage Vital Signs ED Triage Vitals [05/02/24 1956]  Encounter Vitals Group     BP 131/82     Girls Systolic BP Percentile      Girls Diastolic BP Percentile      Boys Systolic BP Percentile      Boys Diastolic BP Percentile      Pulse Rate 81     Resp 16     Temp 98.9 F (37.2 C)     Temp Source Oral     SpO2 97 %     Weight      Height      Head Circumference      Peak Flow      Pain Score 6     Pain Loc      Pain Education      Exclude from Growth Chart    No data found.  Updated Vital Signs BP 131/82 (BP Location: Left Arm)   Pulse 81   Temp 98.9 F (37.2 C) (Oral)   Resp 16   SpO2 97%   Visual Acuity Right Eye Distance:   Left Eye Distance:   Bilateral Distance:    Right Eye Near:   Left Eye Near:    Bilateral Near:     Physical  Exam Vitals and nursing note reviewed.  Constitutional:      General: He is not in acute distress.    Appearance: Normal appearance. He is not ill-appearing, toxic-appearing or diaphoretic.  Eyes:     General: No scleral icterus. Cardiovascular:     Rate and Rhythm: Normal rate and regular rhythm.     Heart sounds: Normal heart sounds.  Pulmonary:     Effort: Pulmonary effort is normal. No respiratory distress.     Breath sounds: Normal breath sounds. No wheezing or rhonchi.  Abdominal:     General: Abdomen is flat. Bowel sounds are normal.     Palpations: Abdomen is soft.     Tenderness: There is no abdominal tenderness. There is no right CVA tenderness or left CVA tenderness.  Skin:    General: Skin is warm.  Neurological:     Mental Status: He is alert and oriented to person, place, and time.  Psychiatric:        Mood and Affect: Mood normal.        Behavior: Behavior normal.      UC Treatments / Results  Labs (all labs ordered are listed, but only abnormal results are displayed) Labs Reviewed  CYTOLOGY,  (ORAL, ANAL, URETHRAL) ANCILLARY ONLY    EKG   Radiology No results found.  Procedures Procedures (including critical care time)  Medications Ordered in UC Medications - No data to display  Initial Impression / Assessment and Plan / UC Course  I have reviewed the triage vital signs and the nursing notes.  Pertinent labs & imaging results that were available during my care of the patient were reviewed by me and considered in my medical decision making (see chart for details).     Contact with STIs-patient was prophylactically treated for gonorrhea and chlamydia in office today.  Patient was given azithromycin  for chlamydia as he is allergic to doxycycline .  Patient advised to refrain from sexual activity for 2 weeks since or until he gets negative test results. Final Clinical Impressions(s) / UC Diagnoses   Final diagnoses:  Contact with and (suspected) exposure to infections with a predominantly sexual mode of transmission  Dysuria     Discharge Instructions      You were tested for STIs today. You should receive your results in 3-5 days. If you have not received a call from the office or see results in your mychart please call the clinic where you were seen. It is best if your refrain from sexual activity until you get results back. If positive you will need to refrain from sexual activity for 2 weeks and be sure to complete any antibiotics prescribed in their entirety.      ED Prescriptions     Medication Sig Dispense Auth. Provider   azithromycin  (ZITHROMAX ) 250 MG tablet Take 4 tablets (1,000 mg total) by mouth once for 1 dose. Take first 2 tablets together, then 1 every day until finished. 4 tablet Andra Corean BROCKS, PA-C      PDMP not reviewed this encounter.   Andra Corean BROCKS, PA-C 05/03/24 [phone]

## 2024-07-16 ENCOUNTER — Ambulatory Visit (HOSPITAL_COMMUNITY): Payer: Self-pay
